# Patient Record
Sex: Female | Born: 1980 | Hispanic: Yes | Marital: Married | Smoking: Never smoker
Health system: Southern US, Community
[De-identification: ages and names within clinical notes are randomized; demographics above are authoritative.]

## PROBLEM LIST (undated history)

## (undated) DIAGNOSIS — F419 Anxiety disorder, unspecified: Secondary | ICD-10-CM

## (undated) DIAGNOSIS — G478 Other sleep disorders: Secondary | ICD-10-CM

## (undated) DIAGNOSIS — Z9889 Other specified postprocedural states: Secondary | ICD-10-CM

## (undated) DIAGNOSIS — F32A Depression, unspecified: Secondary | ICD-10-CM

## (undated) DIAGNOSIS — G709 Myoneural disorder, unspecified: Secondary | ICD-10-CM

## (undated) DIAGNOSIS — T8859XA Other complications of anesthesia, initial encounter: Secondary | ICD-10-CM

## (undated) DIAGNOSIS — F329 Major depressive disorder, single episode, unspecified: Secondary | ICD-10-CM

## (undated) DIAGNOSIS — J40 Bronchitis, not specified as acute or chronic: Secondary | ICD-10-CM

## (undated) DIAGNOSIS — A159 Respiratory tuberculosis unspecified: Secondary | ICD-10-CM

## (undated) DIAGNOSIS — T4145XA Adverse effect of unspecified anesthetic, initial encounter: Secondary | ICD-10-CM

## (undated) HISTORY — PX: WISDOM TOOTH EXTRACTION: SHX21

## (undated) HISTORY — PX: AUGMENTATION MAMMAPLASTY: SUR837

## (undated) HISTORY — PX: COSMETIC SURGERY: SHX468

## (undated) HISTORY — PX: MENISCECTOMY: SHX123

---
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
## 2010-10-10 HISTORY — PX: TURBINATE REDUCTION: SHX6157

## 2013-10-10 DIAGNOSIS — J40 Bronchitis, not specified as acute or chronic: Secondary | ICD-10-CM

## 2013-10-10 HISTORY — DX: Bronchitis, not specified as acute or chronic: J40

## 2015-10-21 ENCOUNTER — Ambulatory Visit: Payer: Managed Care, Other (non HMO) | Attending: Sports Medicine

## 2015-10-21 DIAGNOSIS — R29898 Other symptoms and signs involving the musculoskeletal system: Secondary | ICD-10-CM | POA: Insufficient documentation

## 2015-10-21 DIAGNOSIS — G8929 Other chronic pain: Secondary | ICD-10-CM | POA: Insufficient documentation

## 2015-10-21 DIAGNOSIS — M25561 Pain in right knee: Secondary | ICD-10-CM | POA: Insufficient documentation

## 2015-10-21 NOTE — Therapy (Addendum)
[facility] [address], [place], [place], [address] Phone: [phone]   Fax:  [phone]  Physical Therapy Evaluation  Patient Details  Name: Tracy Hartman MRN: [id number] Date of Birth: [birth year] Referring Provider: Robb Matar, MD  Encounter Date: 10/21/2015      PT End of Session - 10/21/15 1051    Visit Number 1   Date for PT Re-Evaluation 12/16/15   PT Start Time 5277   PT Stop Time 1056   PT Time Calculation (min) 35 min   Activity Tolerance Patient tolerated treatment well   Behavior During Therapy [facility] for tasks assessed/performed      History reviewed. No pertinent past medical history.  History reviewed. No pertinent past surgical history.  There were no vitals filed for this visit.  Visit Diagnosis:  Knee pain, chronic, right - Plan: PT plan of care cert/re-cert  Weakness of right hip - Plan: PT plan of care cert/re-cert      Subjective Assessment - 10/21/15 1027    Subjective Pt presents to PT with complaints of Rt knee pain.  Pt had surgery for menesectomy in 2014.  Recent MRI indicated internal derangement per pt report.  Pt reports that she might need surgery again but is not going to do it now as she just moved from [place].     Pertinent History Rt knee meniscus surgery 2014   Diagnostic tests MRI 04/2015: internal derangement   Patient Stated Goals reduce Rt knee pain, increase strength   Currently in Pain? Yes   Pain Score 3    Pain Location Knee   Pain Orientation Right   Pain Descriptors / Indicators Tightness   Pain Type Acute pain   Pain Onset More than a month ago   Pain Frequency Intermittent   Aggravating Factors  walking    Pain Relieving Factors most of the time, no pain            [facility] PT Assessment - 10/21/15 0001    Assessment   Medical Diagnosis Rt knee medial menisus tear    Referring Provider Robb Matar, MD   Onset Date/Surgical Date 10/20/12   surgery 08/2013   Next MD Visit none   Prior Therapy in 2014 after surgery   Precautions   Precautions None   Restrictions   Weight Bearing Restrictions No   Balance Screen   Has the patient fallen in the past 6 months No   Has the patient had a decrease in activity level because of a fear of falling?  No   Is the patient reluctant to leave their home because of a fear of falling?  No   Home Social worker Private residence   Living Arrangements Spouse/significant other;Children   Type of [place] to enter   Home Layout Two level   Prior Function   Level of Independence Independent   Vocation Unemployed   Vocation Requirements flight attendant- off for 7 months now   [place]   Overall Cognitive Status Within Functional Limits for tasks assessed   Observation/Other Assessments   Focus on Therapeutic Outcomes (FOTO)  37% limitation   Posture/Postural Control   Posture/Postural Control No significant limitations   ROM / Strength   AROM / PROM / Strength AROM;PROM;Strength   AROM   Overall AROM  Within functional limits for tasks performed   Overall AROM Comments full knee and hip AROM bilaterally without  pain   PROM   Overall PROM  Within functional limits for tasks performed   Strength   Overall Strength Deficits   Strength Assessment Site Knee;Hip   Right/Left Hip Right;Left   Right Hip Flexion 4/5   Right Hip Extension 4/5   Right Hip ABduction 4-/5   Left Hip Flexion 4+/5   Left Hip Extension 5/5   Left Hip ABduction 5/5   Right/Left Knee Right;Left   Right Knee Flexion 5/5   Right Knee Extension 4+/5   Left Knee Flexion 5/5   Left Knee Extension 5/5   Palpation   Patella mobility increased patellar mobility on the Rt vs Lt.   Palpation comment no palpable tenderness today   Ambulation/Gait   Ambulation/Gait Yes   Ambulation/Gait Assistance 7: Independent                            PT Education - 10/21/15 1046    Education provided Yes   Education Details HEP: hip flexion, abduction and extension, long arc quads   Person(s) Educated Patient   Methods Explanation;Demonstration;Handout   Comprehension Returned demonstration;Verbalized understanding          PT Short Term Goals - 10/21/15 1100    PT SHORT TERM GOAL #1   Title be independent in initial HEP   Time 4   Period Weeks   Status New           PT Long Term Goals - 10/21/15 1023    PT LONG TERM GOAL #1   Title be independent in advanced HEP   Time 8   Period Weeks   Status New   PT LONG TERM GOAL #2   Title reduce FOTO to < or = to 30% limitaiton   Time 8   Period Weeks   Status New   PT LONG TERM GOAL #3   Title demonstrate 4+/5 Rt hip strength to improve endurance and stability   Time 8   Period Weeks   Status New   PT LONG TERM GOAL #4   Title verbalize and demonstrate advancement of hip and core strength exercises for independence after discharge.                 Plan - 10/21/15 1056    Clinical Impression Statement Pt presents to PT with complaints of Rt knee and hip weakness of a chronic nature. Pt had medial meniscus removal in 2014 and continues to have stiffness, instability and weakness.  Pt demonstrates Rt hip weakness and patellar hypermobility. FOTO score is 37% limitation.  Pt will benefit from skilled PT for hip and core strength, Rt knee stability and prioprioception to improve stability.     Pt will benefit from skilled therapeutic intervention in order to improve on the following deficits Pain;Decreased strength   Rehab Potential Good   PT Frequency 2x / week   PT Duration 6 weeks   PT Treatment/Interventions ADLs/Self Care Home Management;Cryotherapy;Electrical Stimulation;Moist Heat;Therapeutic exercise;Therapeutic activities;Ultrasound;Neuromuscular re-education;Patient/family education;Manual techniques;Passive range of motion   PT Next Visit Plan pilates  based hip strength, Rt knee strength and stabilization   Consulted and Agree with Plan of Care Patient         Problem List There are no active problems to display for this patient.   Trinda Harlacher, PT 10/21/2015, 11:39 AM PHYSICAL THERAPY DISCHARGE SUMMARY  Visits from Start of Care: 1  Current functional level related to goals / functional outcomes: See  above.  Pt didn't return after evaluation.     Remaining deficits: See above for current status.    Education / Equipment: HEP Plan: Patient agrees to discharge.  Patient goals were not met. Patient is being discharged due to not returning since the last visit.  ?????    Sigurd Sos, [place] 12/02/2015 9:48 AM [facility] [address] [place], [place], [address] Phone: [phone]   Fax:  [phone]  Name: Tracy Hartman MRN: [id number] Date of Birth: [birth year]

## 2015-10-21 NOTE — Patient Instructions (Addendum)
2 sets of 10, 2x/day  Hip Flexion / Knee Extension: Straight-Leg Raise (Eccentric)   Lie on back. Lift leg with knee straight. Slowly lower leg for 3-5 seconds. ___ reps per set, ___ sets per day, ___ days per week. Lower like elevator, stopping at each floor. Add ___ lbs when you achieve ___ repetitions. Rest on elbows. Rest on straight arms.  ABDUCTION: Side-Lying (Active)   Lie on left side, top leg straight. Raise top leg as far as possible. Use ___ lbs. Complete ___ sets of ___ repetitions. Perform ___ sessions per day.  http://gtsc.exer.[email or web address]   (Home) Extension: Hip   With support under abdomen, tighten stomach. Lift right leg in line with body. Do not hyperextend. Alternate legs. Repeat ____ times per set. Do ____ sets per session. Do ____ sessions per week.  KNEE: Extension, Long Arc Quads - Sitting    Raise leg until knee is straight.  Hold 5 seconds, do 10-20 on each leg ___ reps per set, _2__ sets per day  Copyright  VHI. All rights reserved.  [facility] [address], [address] [place], [place] [address] Phone # [phone] Fax [phone]

## 2015-11-12 ENCOUNTER — Other Ambulatory Visit: Payer: Self-pay | Admitting: Internal Medicine

## 2015-11-12 DIAGNOSIS — N63 Unspecified lump in unspecified breast: Secondary | ICD-10-CM

## 2015-11-12 DIAGNOSIS — Z9882 Breast implant status: Secondary | ICD-10-CM

## 2015-11-17 ENCOUNTER — Ambulatory Visit
Admission: RE | Admit: 2015-11-17 | Discharge: 2015-11-17 | Disposition: A | Discharge status: Home / Self Care | Payer: Managed Care, Other (non HMO) | Source: Ambulatory Visit | Attending: Internal Medicine | Admitting: Internal Medicine

## 2015-11-17 DIAGNOSIS — N63 Unspecified lump in unspecified breast: Secondary | ICD-10-CM

## 2015-11-17 DIAGNOSIS — Z9882 Breast implant status: Secondary | ICD-10-CM

## 2016-07-15 ENCOUNTER — Other Ambulatory Visit: Payer: Self-pay | Admitting: Internal Medicine

## 2016-07-15 DIAGNOSIS — Z9882 Breast implant status: Secondary | ICD-10-CM

## 2016-07-15 DIAGNOSIS — N6489 Other specified disorders of breast: Secondary | ICD-10-CM

## 2016-07-21 ENCOUNTER — Ambulatory Visit
Admission: RE | Admit: 2016-07-21 | Discharge: 2016-07-21 | Disposition: A | Discharge status: Home / Self Care | Payer: Managed Care, Other (non HMO) | Source: Ambulatory Visit | Attending: Internal Medicine | Admitting: Internal Medicine

## 2016-07-21 DIAGNOSIS — N6489 Other specified disorders of breast: Secondary | ICD-10-CM

## 2016-07-21 DIAGNOSIS — Z9882 Breast implant status: Secondary | ICD-10-CM

## 2016-12-28 ENCOUNTER — Other Ambulatory Visit: Payer: Self-pay | Admitting: Internal Medicine

## 2016-12-28 DIAGNOSIS — N6489 Other specified disorders of breast: Secondary | ICD-10-CM

## 2016-12-30 ENCOUNTER — Ambulatory Visit
Admission: RE | Admit: 2016-12-30 | Discharge: 2016-12-30 | Disposition: A | Discharge status: Home / Self Care | Payer: Managed Care, Other (non HMO) | Source: Ambulatory Visit | Attending: Internal Medicine | Admitting: Internal Medicine

## 2016-12-30 ENCOUNTER — Other Ambulatory Visit: Payer: Self-pay | Admitting: Internal Medicine

## 2016-12-30 DIAGNOSIS — N6489 Other specified disorders of breast: Secondary | ICD-10-CM

## 2017-01-24 ENCOUNTER — Encounter: Payer: Self-pay | Admitting: Physical Therapy

## 2017-01-24 ENCOUNTER — Ambulatory Visit: Payer: Managed Care, Other (non HMO) | Attending: Family Medicine | Admitting: Physical Therapy

## 2017-01-24 DIAGNOSIS — M25661 Stiffness of right knee, not elsewhere classified: Secondary | ICD-10-CM

## 2017-01-24 DIAGNOSIS — R29898 Other symptoms and signs involving the musculoskeletal system: Secondary | ICD-10-CM | POA: Diagnosis present

## 2017-01-24 DIAGNOSIS — M25561 Pain in right knee: Secondary | ICD-10-CM | POA: Diagnosis present

## 2017-01-24 DIAGNOSIS — M6281 Muscle weakness (generalized): Secondary | ICD-10-CM | POA: Insufficient documentation

## 2017-01-24 DIAGNOSIS — G8929 Other chronic pain: Secondary | ICD-10-CM

## 2017-01-24 DIAGNOSIS — M545 Low back pain, unspecified: Secondary | ICD-10-CM

## 2017-01-24 NOTE — Therapy (Signed)
[facility] [address] [place], [place], [address] Phone: [phone]   Fax:  [phone]  Physical Therapy Evaluation  Patient Details  Name: Tracy Hartman MRN: [id number] Date of Birth: 27-Dec-1980 Referring Provider: Otho Darner, MD  Encounter Date: 01/24/2017      PT End of Session - 01/24/17 1904    Visit Number 1   Date for PT Re-Evaluation 03/21/17   PT Start Time 0849   PT Stop Time 0930   PT Time Calculation (min) 41 min   Activity Tolerance Patient tolerated treatment well   Behavior During Therapy [facility] for tasks assessed/performed      History reviewed. No pertinent past medical history.  Past Surgical History:  Procedure Laterality Date  . AUGMENTATION MAMMAPLASTY      There were no vitals filed for this visit.       Subjective Assessment - 01/24/17 0853    Subjective Had a knee injury when 36 y/o and meniscus surgery in 2014.  Had back pain also for years also, but has been getting worse and having problems bending forward.  Did pilates physical therapy in the past which helped.  Knee is tight and feels weak on right LE.     Limitations Walking   How long can you walk comfortably? 30-45 minutes   Patient Stated Goals get rid of the pain, strengthen   Currently in Pain? Yes   Pain Score 3    Pain Location Back   Pain Orientation Mid;Lower   Pain Descriptors / Indicators Dull;Aching   Pain Type Chronic pain   Pain Onset More than a month ago   Pain Frequency Intermittent   Aggravating Factors  bending forward and getting back up from leaning forward   Pain Relieving Factors heat, stretching   Effect of Pain on Daily Activities playing with kids   Multiple Pain Sites Yes   Pain Score 4  when walking a lot   Pain Location Knee   Pain Orientation Right   Pain Descriptors / Indicators Aching;Sharp   Pain Radiating Towards side of the knee or general soreness all over   Pain Onset  More than a month ago   Pain Frequency Intermittent   Aggravating Factors  walking   Pain Relieving Factors ibuprophen, injection, sitting   Effect of Pain on Daily Activities walking            [facility] PT Assessment - 01/24/17 0001      Assessment   Medical Diagnosis thoracolumbar pain; right knee patellofemoral syndrome; s/p menisectomy 2014   Referring Provider Otho Darner, MD   Onset Date/Surgical Date --  36 years old   Prior Therapy yes     Precautions   Precautions None     Restrictions   Weight Bearing Restrictions No     Balance Screen   Has the patient fallen in the past 6 months No   Has the patient had a decrease in activity level because of a fear of falling?  No   Is the patient reluctant to leave their home because of a fear of falling?  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, dispensing and leaning for household activities   Leisure hiking and walking, golfing  Cognition   Overall Cognitive Status Within Functional Limits for tasks assessed     Observation/Other Assessments   Focus on Therapeutic Outcomes (FOTO)  34% limitation  goal 27% limitation     Posture/Postural Control   Posture/Postural Control Postural limitations   Postural Limitations Rounded Shoulders     ROM / Strength   AROM / PROM / Strength Strength;AROM;PROM     AROM   Overall AROM Comments lumbar flexion 25% limited with pain; right knee 10% limited      PROM   Right/Left Knee Right;Left   Right Knee Extension 0   Right Knee Flexion 150   Left Knee Extension 0   Left Knee Flexion 160     Strength   Strength Assessment Site Hip   Right Hip Extension 4-/5   Right Hip ABduction 4-/5   Right Hip ADduction 4-/5   Left Hip ADduction 4-/5     Palpation    Palpation comment right IT band tight     Ambulation/Gait   Gait Pattern Within Functional Limits                           PT Education - 01/24/17 1903    Education provided Yes   Education Details thoracolumbar stretches   Person(s) Educated Patient   Methods Explanation;Verbal cues;Tactile cues;Handout   Comprehension Verbalized understanding;Returned demonstration          PT Short Term Goals - 01/24/17 1906      PT SHORT TERM GOAL #1   Title be independent in initial HEP   Time 4   Period Weeks   Status New     PT SHORT TERM GOAL #2   Title reports 25% reduced back pain during daily activities   Time 4   Period Weeks   Status New           PT Long Term Goals - 01/24/17 1906      PT LONG TERM GOAL #1   Title be independent in advanced HEP   Time 8   Period Weeks   Status New     PT LONG TERM GOAL #2   Title reduce FOTO to < or = to 27% limitaiton   Time 8   Period Weeks   Status New     PT LONG TERM GOAL #3   Title demonstrate 4+/5 Rt hip strength to improve endurance and stability   Time 8   Period Weeks   Status New     PT LONG TERM GOAL #4   Title be able to do activities such as bathe daughter without increased back pain               Plan - 01/24/17 1912    Clinical Impression Statement Pt presents to clinic with knee and back pain.  Patient is active parent and having increased difficulty performing household chores due to increased pain.  Pt has back pain of 3/10, T9-T12 hypomobility and increased muscle spasms of thoracic paraspinals.  Pt also presents with knee pain 4/10 and limited ROM of 150 degrees of flexion compared to 160 degrees on left LE.  Pt has tight IT band Rt LE.  She demonstrates weakness in right hip 4-/5 and core weakness during funcitonal movments.  Patient will benefit from skilled PT to address core and LE weakness and decreased ROM for improved function and pain management.  PT is low  complexity due to no comorbidities.  Rehab Potential Excellent   Clinical Impairments Affecting Rehab Potential n/a   PT Frequency 3x / week   PT Duration 8 weeks   PT Treatment/Interventions ADLs/Self Care Home Management;Biofeedback;Cryotherapy;Iontophoresis /ml Dexamethasone;Moist Heat;Traction;Ultrasound;Gait training;Stair training;Functional mobility training;Therapeutic activities;Therapeutic exercise;Balance training;Neuromuscular re-education;Patient/family education;Manual techniques;Dry needling;Passive range of motion;Taping   PT Next Visit Plan thoracic mobility, manual and modalities as needed, LE and core strengthening   Recommended Other Services n/a   Consulted and Agree with Plan of Care Patient      Patient will benefit from skilled therapeutic intervention in order to improve the following deficits and impairments:  Decreased range of motion, Decreased strength, Hypomobility, Postural dysfunction, Increased muscle spasms, Pain  Visit Diagnosis: Chronic midline low back pain without sciatica  Muscle weakness (generalized)  Chronic pain of right knee  Stiffness of right knee, not elsewhere classified     Problem List There are no active problems to display for this patient.   Vincente Poli, PT 01/24/2017, 7:26 PM  [facility] [address] [place], [place], [address] Phone: [phone]   Fax:  [phone]  Name: Tracy Hartman MRN: [id number] Date of Birth: 08-Apr-1981

## 2017-01-24 NOTE — Patient Instructions (Addendum)
   Supine Knee-to-Chest, Unilateral    Lie on back, hands clasped behind one knee. Pull knee in toward chest until a comfortable stretch is felt in lower back and buttocks. Hold 30___ seconds.  Repeat __2_ times per session. Do _1__ sessions per day.  Copyright  VHI. All rights reserved.  Supine With Rotation    Lie on back with one knee drawn toward chest. Slowly bring bent leg across body until stretch is felt in lower back area. Hold _30__ seconds. Repeat to other side. Repeat _2__ times per session. Do __2_ sessions per day.  Copyright  VHI. All rights reserved.         Book Opening  Setup- -Side lying, Knees bent -Arms out stretched to 90 degrees of shoulder -Head on small cushion or hand under head  Exercise- -Inhale -Exhale, and reach top arm up toward the ceiling, allowing eyes and sternum, ribs and spine to follow(keep legs in starting position) -Continue rotation as far as possible without letting the shoulder press forward(Arm may not touch mat) --Repeat on other side after completing set  Focus on- -not just moving the arm  10x to each side  [facility] [address] [place], [place] [address] Phone # [phone] Fax [phone]

## 2017-01-25 ENCOUNTER — Encounter: Payer: Self-pay | Admitting: Physical Therapy

## 2017-01-25 ENCOUNTER — Ambulatory Visit: Payer: Managed Care, Other (non HMO) | Admitting: Physical Therapy

## 2017-01-25 DIAGNOSIS — M545 Low back pain, unspecified: Secondary | ICD-10-CM

## 2017-01-25 DIAGNOSIS — M6281 Muscle weakness (generalized): Secondary | ICD-10-CM

## 2017-01-25 DIAGNOSIS — G8929 Other chronic pain: Secondary | ICD-10-CM

## 2017-01-25 DIAGNOSIS — M25561 Pain in right knee: Secondary | ICD-10-CM

## 2017-01-25 DIAGNOSIS — M25661 Stiffness of right knee, not elsewhere classified: Secondary | ICD-10-CM

## 2017-01-25 DIAGNOSIS — R29898 Other symptoms and signs involving the musculoskeletal system: Secondary | ICD-10-CM

## 2017-01-25 NOTE — Therapy (Signed)
[facility] [address] [place], [place], [address] Phone: [phone]   Fax:  [phone]  Physical Therapy Treatment  Patient Details  Name: Tracy Hartman MRN: [id number] Date of Birth: December 28, 1980 Referring Provider: Otho Darner, MD  Encounter Date: 01/25/2017      PT End of Session - 01/25/17 0853    Visit Number 2   Date for PT Re-Evaluation 03/21/17   PT Start Time 0845   PT Stop Time 0945   PT Time Calculation (min) 60 min   Activity Tolerance Patient tolerated treatment well   Behavior During Therapy [facility] for tasks assessed/performed      History reviewed. No pertinent past medical history.  Past Surgical History:  Procedure Laterality Date  . AUGMENTATION MAMMAPLASTY      There were no vitals filed for this visit.      Subjective Assessment - 01/25/17 0849    Subjective Knee feels stiff this morning. my back is uncomfortable this morning and I am ovulating which does not help.    Currently in Pain? --  My back is uncomfortable this AM.   Multiple Pain Sites No                         [facility] Adult PT Treatment/Exercise - 01/25/17 0001      Lumbar Exercises: Stretches   Piriformis Stretch Limitations Sidelying thoracic rotation  10x bil      Lumbar Exercises: Supine   Other Supine Lumbar Exercises Foam roll exs: decompression x 1 min, ball squeeze with TA contraction 6x, scissor arms 6x bil, tiny steps alternating 6x, yellow band horizontal abd 10x   Lumbar release series     Knee/Hip Exercises: Aerobic   Nustep L2 x 6 min  Concurrent review of status     Moist Heat Therapy   Number Minutes Moist Heat 15 Minutes   Moist Heat Location --  RT knee and lumbar                PT Education - 01/25/17 0907    Education provided Yes   Education Details Single leg stance RT for HEP   Person(s) Educated Patient   Methods Explanation;Demonstration;Tactile  cues;Verbal cues;Handout   Comprehension Verbalized understanding;Returned demonstration          PT Short Term Goals - 01/24/17 1906      PT SHORT TERM GOAL #1   Title be independent in initial HEP   Time 4   Period Weeks   Status New     PT SHORT TERM GOAL #2   Title reports 25% reduced back pain during daily activities   Time 4   Period Weeks   Status New           PT Long Term Goals - 01/24/17 1906      PT LONG TERM GOAL #1   Title be independent in advanced HEP   Time 8   Period Weeks   Status New     PT LONG TERM GOAL #2   Title reduce FOTO to < or = to 27% limitaiton   Time 8   Period Weeks   Status New     PT LONG TERM GOAL #3   Title demonstrate 4+/5 Rt hip strength to improve endurance and stability   Time 8   Period Weeks   Status New     PT LONG TERM GOAL #4   Title be  able to do activities such as bathe daughter without increased back pain               Plan - 01/25/17 0853    Clinical Impression Statement First session post eval. Pt is compliant in her initial HEP, meeting goal. Pt demonstrated good ability to contract her core.  Added to HEP today to address Rt hip weakness, knee & back pain.    Rehab Potential Excellent   Clinical Impairments Affecting Rehab Potential n/a   PT Frequency 3x / week   PT Duration 8 weeks   PT Treatment/Interventions ADLs/Self Care Home Management;Biofeedback;Cryotherapy;Iontophoresis /ml Dexamethasone;Moist Heat;Traction;Ultrasound;Gait training;Stair training;Functional mobility training;Therapeutic activities;Therapeutic exercise;Balance training;Neuromuscular re-education;Patient/family education;Manual techniques;Dry needling;Passive range of motion;Taping   PT Next Visit Plan review new HEp given today, progress core strength, continue to work with foam roll   Consulted and Agree with Plan of Care Patient      Patient will benefit from skilled therapeutic intervention in order to improve the  following deficits and impairments:  Decreased range of motion, Decreased strength, Hypomobility, Postural dysfunction, Increased muscle spasms, Pain  Visit Diagnosis: Chronic midline low back pain without sciatica  Muscle weakness (generalized)  Chronic pain of right knee  Stiffness of right knee, not elsewhere classified  Weakness of right hip     Problem List There are no active problems to display for this patient.   Teghan Philbin, PTA 01/25/2017, 9:34 AM  [facility] [address], [address] [place], [place], [address] Phone: [phone]   Fax:  [phone]  Name: Jaquanna Ballentine MRN: [id number] Date of Birth: [birth year]

## 2017-01-25 NOTE — Patient Instructions (Signed)
Foam roll on [employer]: OPTP Pro Soft Roller. Comes in blue or pink   #1 Single leg stance exercise for the RIGHT leg: Shift your weight into the right leg, contract the quad and glutes gently. Then balance on the right leg 10sec up to 30 sec. Make sure to lift from the lower belly and relax the shoulders and ribs. Do this 1-3 reps but sprinkle the exercise throughout the day.    Supine: Leg Stretch With Strap (Basic)    Lie on back with one knee bent, foot flat on floor. Hook strap around other foot. Straighten knee. Keep knee level with other knee. Hold __30_ seconds. Relax leg completely down to floor.  Repeat 3___ times per session. Do __1-3_ sessions per day.  Copyright  VHI. All rights reserved.

## 2017-01-27 ENCOUNTER — Encounter: Payer: Self-pay | Admitting: Physical Therapy

## 2017-01-27 ENCOUNTER — Ambulatory Visit: Payer: Managed Care, Other (non HMO) | Admitting: Physical Therapy

## 2017-01-27 DIAGNOSIS — M545 Low back pain, unspecified: Secondary | ICD-10-CM

## 2017-01-27 DIAGNOSIS — M6281 Muscle weakness (generalized): Secondary | ICD-10-CM

## 2017-01-27 DIAGNOSIS — G8929 Other chronic pain: Secondary | ICD-10-CM

## 2017-01-27 DIAGNOSIS — M25561 Pain in right knee: Secondary | ICD-10-CM

## 2017-01-27 DIAGNOSIS — M25661 Stiffness of right knee, not elsewhere classified: Secondary | ICD-10-CM

## 2017-01-27 NOTE — Therapy (Signed)
[facility] [address], [address] [place], [place], [address] Phone: [phone]   Fax:  [phone]  Physical Therapy Treatment  Patient Details  Name: Tracy Hartman MRN: [id number] Date of Birth: 09-01-1981 Referring Provider: Otho Darner, MD  Encounter Date: 01/27/2017      PT End of Session - 01/27/17 0849    Visit Number 3   Date for PT Re-Evaluation 03/21/17   PT Start Time 0849   PT Stop Time 0945   PT Time Calculation (min) 56 min   Activity Tolerance Patient tolerated treatment well   Behavior During Therapy [facility] for tasks assessed/performed      History reviewed. No pertinent past medical history.  Past Surgical History:  Procedure Laterality Date  . AUGMENTATION MAMMAPLASTY      There were no vitals filed for this visit.      Subjective Assessment - 01/27/17 0851    Subjective Felt good after session. Pain seems to becoming more local just below the bra strap area. Denies pain this AM.    Currently in Pain? No/denies   Multiple Pain Sites No                         [facility] Adult PT Treatment/Exercise - 01/27/17 0001      Therapeutic Activites    Therapeutic Activities --  Ways to bath daughter while supporting her back     Lumbar Exercises: Supine   Bridge 10 reps;3 seconds   Other Supine Lumbar Exercises Foam roll exs: decompression x 2 min, ball squeeze with TA contraction 8x, scissor arms 8x bil, tiny steps alternating 8x, yellow band horizontal abd 10x   Lumbar release series   Other Supine Lumbar Exercises Thoracic extension on foam roll     Knee/Hip Exercises: Stretches   Hip Flexor Stretch Both;3 reps;30 seconds     Knee/Hip Exercises: Aerobic   Nustep L3 x 7 min  Concurrent review of status     Knee/Hip Exercises: Supine   Straight Leg Raises --  Pilates style SLR 10x     Knee/Hip Exercises: Sidelying   Hip ABduction --  10x PIlates bias     Knee/Hip  Exercises: Prone   Hip Extension --  10x Pilates bias     Moist Heat Therapy   Number Minutes Moist Heat 15 Minutes   Moist Heat Location --  RT knee and lumbar                  PT Short Term Goals - 01/24/17 1906      PT SHORT TERM GOAL #1   Title be independent in initial HEP   Time 4   Period Weeks   Status New     PT SHORT TERM GOAL #2   Title reports 25% reduced back pain during daily activities   Time 4   Period Weeks   Status New           PT Long Term Goals - 01/24/17 1906      PT LONG TERM GOAL #1   Title be independent in advanced HEP   Time 8   Period Weeks   Status New     PT LONG TERM GOAL #2   Title reduce FOTO to < or = to 27% limitaiton   Time 8   Period Weeks   Status New     PT LONG TERM GOAL #3   Title demonstrate 4+/5  Rt hip strength to improve endurance and stability   Time 8   Period Weeks   Status New     PT LONG TERM GOAL #4   Title be able to do activities such as bathe daughter without increased back pain               Plan - 01/27/17 0856    Clinical Impression Statement Pt reports feeling slightly better first week of PT and doing her HEP. Pt was very challenged by the SLR x 3 directions. No pain during the session.  Continues to be compliant with her HEP. Regarding her pain levels, pt reports that pain in her back is still present at times BUT it seems to be not as intense.     Rehab Potential Excellent   Clinical Impairments Affecting Rehab Potential n/a   PT Frequency 3x / week   PT Duration 8 weeks   PT Treatment/Interventions ADLs/Self Care Home Management;Biofeedback;Cryotherapy;Iontophoresis /ml Dexamethasone;Moist Heat;Traction;Ultrasound;Gait training;Stair training;Functional mobility training;Therapeutic activities;Therapeutic exercise;Balance training;Neuromuscular re-education;Patient/family education;Manual techniques;Dry needling;Passive range of motion;Taping   PT Next Visit Plan RT hip  strength, cont with core strength and work towards Rt quads.   Consulted and Agree with Plan of Care Patient      Patient will benefit from skilled therapeutic intervention in order to improve the following deficits and impairments:  Decreased range of motion, Decreased strength, Hypomobility, Postural dysfunction, Increased muscle spasms, Pain  Visit Diagnosis: Chronic midline low back pain without sciatica  Muscle weakness (generalized)  Chronic pain of right knee  Stiffness of right knee, not elsewhere classified     Problem List There are no active problems to display for this patient.   Koty Anctil, PTA 01/27/2017, 9:27 AM   [facility] [address], [address] [place], [place], [address] Phone: [phone]   Fax:  [phone]  Name: Tracy Hartman MRN: [id number] Date of Birth: 03-31-1981

## 2017-01-30 ENCOUNTER — Encounter: Payer: Self-pay | Admitting: Physical Therapy

## 2017-01-30 ENCOUNTER — Ambulatory Visit: Payer: Managed Care, Other (non HMO) | Admitting: Physical Therapy

## 2017-01-30 DIAGNOSIS — M545 Low back pain, unspecified: Secondary | ICD-10-CM

## 2017-01-30 DIAGNOSIS — M25661 Stiffness of right knee, not elsewhere classified: Secondary | ICD-10-CM

## 2017-01-30 DIAGNOSIS — M6281 Muscle weakness (generalized): Secondary | ICD-10-CM

## 2017-01-30 DIAGNOSIS — G8929 Other chronic pain: Secondary | ICD-10-CM

## 2017-01-30 DIAGNOSIS — M25561 Pain in right knee: Secondary | ICD-10-CM

## 2017-01-30 DIAGNOSIS — R29898 Other symptoms and signs involving the musculoskeletal system: Secondary | ICD-10-CM

## 2017-01-30 NOTE — Therapy (Signed)
[facility] [address], [place], [place], [address] Phone: [phone]   Fax:  [phone]  Physical Therapy Treatment  Patient Details  Name: Tracy Hartman MRN: [id number] Date of Birth: Oct 21, 1980 Referring Provider: Gentry Fitz, MD  Encounter Date: 01/30/2017      PT End of Session - 01/30/17 0853    Visit Number 4   Date for PT Re-Evaluation 03/21/17   PT Start Time 0853   PT Stop Time 1010   PT Time Calculation (min) 77 min   Activity Tolerance Patient tolerated treatment well   Behavior During Therapy [facility] for tasks assessed/performed      History reviewed. No pertinent past medical history.  Past Surgical History:  Procedure Laterality Date  . AUGMENTATION MAMMAPLASTY      There were no vitals filed for this visit.      Subjective Assessment - 01/30/17 0855    Subjective I am feeling better, mack remains with some pain intermittently   Currently in Pain? No/denies   Multiple Pain Sites No                         [facility] Adult PT Treatment/Exercise - 01/30/17 0001      Lumbar Exercises: Supine   Bridge 10 reps;3 seconds  VC for segmental articulation   Other Supine Lumbar Exercises Foam roll exs: decompression x 1 min, ball squeeze with TA contraction 10x, scissor arms 8x bil, tiny steps with LE in table top alternating 10x, red band horizontal abd 10x 2  Lumbar release series   Other Supine Lumbar Exercises Thoracic extension on foam roll     Lumbar Exercises: Quadruped   Madcat/Old Horse 10 reps     Knee/Hip Exercises: Aerobic   Nustep L3 x 10 min     Moist Heat Therapy   Number Minutes Moist Heat 15 Minutes   Moist Heat Location --  RT knee and lumbar & thoracic     Manual Therapy   Manual Therapy Soft tissue mobilization   Soft tissue mobilization RT thoracic to lumbar, stretching to QL in sidelying                PT Education - 01/30/17 0920    Education provided Yes   Education Details Cat Camel   Person(s) Educated Patient   Methods Explanation;Demonstration;Tactile cues;Verbal cues;Handout   Comprehension Verbalized understanding;Returned demonstration          PT Short Term Goals - 01/30/17 0859      PT SHORT TERM GOAL #1   Title be independent in initial HEP   Time 4   Period Weeks   Status Achieved     PT SHORT TERM GOAL #2   Title reports 25% reduced back pain during daily activities   Time 4   Period Weeks   Status Achieved           PT Long Term Goals - 01/24/17 1906      PT LONG TERM GOAL #1   Title be independent in advanced HEP   Time 8   Period Weeks   Status New     PT LONG TERM GOAL #2   Title reduce FOTO to < or = to 27% limitaiton   Time 8   Period Weeks   Status New     PT LONG TERM GOAL #3   Title demonstrate 4+/5 Rt hip strength to improve endurance and stability  Time 8   Period Weeks   Status New     PT LONG TERM GOAL #4   Title be able to do activities such as bathe daughter without increased back pain               Plan - 01/30/17 0949    Clinical Impression Statement Pt has met all STG as of today. She has no pain washing dishes in the last 2 days although she stills feels a lot of stiffness with her stretching in the morning. Pt tends to shorten her QL during exercises which wew stretched out today and it felt better. Pt struggles with body awareness especially thoracic extension. Gave cat cow for HEP to work on this at home.     Rehab Potential Excellent   Clinical Impairments Affecting Rehab Potential n/a   PT Frequency 3x / week   PT Duration 8 weeks   PT Treatment/Interventions ADLs/Self Care Home Management;Biofeedback;Cryotherapy;Iontophoresis 50m/ml Dexamethasone;Moist Heat;Traction;Ultrasound;Gait training;Stair training;Functional mobility training;Therapeutic activities;Therapeutic exercise;Balance training;Neuromuscular re-education;Patient/family  education;Manual techniques;Dry needling;Passive range of motion;Taping   PT Next Visit Plan RT hip strength, cont with core strength and thoracic extension ROM. Check the cat cow HEp given today to see if she has improved with thoracic extension.    Consulted and Agree with Plan of Care Patient      Patient will benefit from skilled therapeutic intervention in order to improve the following deficits and impairments:  Decreased range of motion, Decreased strength, Hypomobility, Postural dysfunction, Increased muscle spasms, Pain  Visit Diagnosis: Chronic midline low back pain without sciatica  Muscle weakness (generalized)  Chronic pain of right knee  Stiffness of right knee, not elsewhere classified  Weakness of right hip     Problem List There are no active problems to display for this patient.   Evely Gainey, PTA 01/30/2017, 9:53 AM  [facility] [address] [place] [address]: [phone]  Fax:  [phone] Name: Tracy MuraskiMRN: [id number] of Birth: 9August 08, 1982

## 2017-01-30 NOTE — Patient Instructions (Addendum)
Cat / Cow Flow    Inhale, press spine toward ceiling like a Halloween cat. Keeping strength in arms and abdominals, exhale to soften spine through neutral and into cow pose. Open chest and arch back. Initiate movement between cat and cow at tailbone, one vertebrae at a time. Repeat _5-10___ times.

## 2017-02-01 ENCOUNTER — Encounter: Payer: Self-pay | Admitting: Physical Therapy

## 2017-02-01 ENCOUNTER — Ambulatory Visit: Payer: Managed Care, Other (non HMO) | Admitting: Physical Therapy

## 2017-02-01 DIAGNOSIS — G8929 Other chronic pain: Secondary | ICD-10-CM

## 2017-02-01 DIAGNOSIS — M545 Low back pain, unspecified: Secondary | ICD-10-CM

## 2017-02-01 DIAGNOSIS — R29898 Other symptoms and signs involving the musculoskeletal system: Secondary | ICD-10-CM

## 2017-02-01 DIAGNOSIS — M6281 Muscle weakness (generalized): Secondary | ICD-10-CM

## 2017-02-01 DIAGNOSIS — M25561 Pain in right knee: Secondary | ICD-10-CM

## 2017-02-01 DIAGNOSIS — M25661 Stiffness of right knee, not elsewhere classified: Secondary | ICD-10-CM

## 2017-02-01 NOTE — Therapy (Signed)
[facility] [address] [place], [place], [address] Phone: [phone]   Fax:  [phone]  Physical Therapy Treatment  Patient Details  Name: Tracy Hartman MRN: [id number] Date of Birth: May 23, 1981 Referring Provider: Otho Darner, MD  Encounter Date: 02/01/2017      PT End of Session - 02/01/17 1448    Visit Number 5   Date for PT Re-Evaluation 03/21/17   PT Start Time 1448   PT Stop Time 1545   PT Time Calculation (min) 57 min   Activity Tolerance Patient tolerated treatment well   Behavior During Therapy [facility] for tasks assessed/performed      History reviewed. No pertinent past medical history.  Past Surgical History:  Procedure Laterality Date  . AUGMENTATION MAMMAPLASTY      There were no vitals filed for this visit.      Subjective Assessment - 02/01/17 1449    Subjective I tried squatting to rinse the tub yesterday and my back hurt. I forgot to try to kneeling and using the tub for support. I have no pain right now . Getting knee injection of Synvisc tomorrow.    Currently in Pain? No/denies   Multiple Pain Sites No                         [facility] Adult PT Treatment/Exercise - 02/01/17 0001      Lumbar Exercises: Stretches   Lower Trunk Rotation Limitations 3x bil      Lumbar Exercises: Supine   Bridge 20 reps;3 seconds  VC for segmental articulation   Other Supine Lumbar Exercises Foam roll exs: decompression x 1 min, ball squeeze with TA contraction 10x, scissor arms 10x bil, tiny steps with LE in table top alternating 10x, red band horizontal abd 15x 2  Lumbar release series   Other Supine Lumbar Exercises Thoracic extension on foam roll     Lumbar Exercises: Quadruped   Madcat/Old Horse 10 reps     Knee/Hip Exercises: Aerobic   Nustep L4 x 10  min  PTA present     Knee/Hip Exercises: Sidelying   Hip ABduction --  10x2  PIlates bias 1# added     Knee/Hip  Exercises: Prone   Hamstring Curl 1 set;10 reps   Hamstring Curl Limitations 1#   Hip Extension --  10x2  Pilates bias 1# added     Moist Heat Therapy   Number Minutes Moist Heat 15 Minutes   Moist Heat Location --  RT knee and lumbar & thoracic                  PT Short Term Goals - 01/30/17 0859      PT SHORT TERM GOAL #1   Title be independent in initial HEP   Time 4   Period Weeks   Status Achieved     PT SHORT TERM GOAL #2   Title reports 25% reduced back pain during daily activities   Time 4   Period Weeks   Status Achieved           PT Long Term Goals - 01/24/17 1906      PT LONG TERM GOAL #1   Title be independent in advanced HEP   Time 8   Period Weeks   Status New     PT LONG TERM GOAL #2   Title reduce FOTO to < or = to 27% limitaiton   Time 8  Period Weeks   Status New     PT LONG TERM GOAL #3   Title demonstrate 4+/5 Rt hip strength to improve endurance and stability   Time 8   Period Weeks   Status New     PT LONG TERM GOAL #4   Title be able to do activities such as bathe daughter without increased back pain               Plan - 02/01/17 1448    Clinical Impression Statement Pt is compliant with her HEP, reporting "I feel it when I stretch." She reports she cannot feel if she moves her spine where we want her to move.  The CAt/COW exercise is improving as pt could get some mid back extension today.    Rehab Potential Excellent   Clinical Impairments Affecting Rehab Potential n/a   PT Frequency 3x / week   PT Duration 8 weeks   PT Treatment/Interventions ADLs/Self Care Home Management;Biofeedback;Cryotherapy;Iontophoresis /ml Dexamethasone;Moist Heat;Traction;Ultrasound;Gait training;Stair training;Functional mobility training;Therapeutic activities;Therapeutic exercise;Balance training;Neuromuscular re-education;Patient/family education;Manual techniques;Dry needling;Passive range of motion;Taping   PT Next Visit Plan  RT hip strength, cont with core strength and thoracic extension ROM.     Consulted and Agree with Plan of Care Patient      Patient will benefit from skilled therapeutic intervention in order to improve the following deficits and impairments:  Decreased range of motion, Decreased strength, Hypomobility, Postural dysfunction, Increased muscle spasms, Pain  Visit Diagnosis: Chronic midline low back pain without sciatica  Muscle weakness (generalized)  Chronic pain of right knee  Stiffness of right knee, not elsewhere classified  Weakness of right hip     Problem List There are no active problems to display for this patient.   Tracy Hartman, PTA 02/01/2017, 3:24 PM  [facility] [address], [address] [place], [place], [address] Phone: [phone]   Fax:  [phone]  Name: Tracy Hartman MRN: [id number] Date of Birth: 27-Jan-1981

## 2017-02-03 ENCOUNTER — Encounter: Payer: Self-pay | Admitting: Physical Therapy

## 2017-02-03 ENCOUNTER — Ambulatory Visit: Payer: Managed Care, Other (non HMO) | Admitting: Physical Therapy

## 2017-02-03 DIAGNOSIS — G8929 Other chronic pain: Secondary | ICD-10-CM

## 2017-02-03 DIAGNOSIS — M545 Low back pain, unspecified: Secondary | ICD-10-CM

## 2017-02-03 DIAGNOSIS — M6281 Muscle weakness (generalized): Secondary | ICD-10-CM

## 2017-02-03 DIAGNOSIS — M25661 Stiffness of right knee, not elsewhere classified: Secondary | ICD-10-CM

## 2017-02-03 DIAGNOSIS — R29898 Other symptoms and signs involving the musculoskeletal system: Secondary | ICD-10-CM

## 2017-02-03 DIAGNOSIS — M25561 Pain in right knee: Secondary | ICD-10-CM

## 2017-02-03 NOTE — Therapy (Signed)
[facility] [address], [address] [place], [place], [address] Phone: [phone]   Fax:  [phone]  Physical Therapy Treatment  Patient Details  Name: Tracy Hartman MRN: [id number] Date of Birth: 05-Aug-1981 Referring Provider: Otho Darner, MD  Encounter Date: 02/03/2017      PT End of Session - 02/03/17 0851    Visit Number 6   Date for PT Re-Evaluation 03/21/17   PT Start Time 0850   PT Stop Time 0940   PT Time Calculation (min) 50 min   Activity Tolerance Patient tolerated treatment well   Behavior During Therapy [facility] for tasks assessed/performed      History reviewed. No pertinent past medical history.  Past Surgical History:  Procedure Laterality Date  . AUGMENTATION MAMMAPLASTY      There were no vitals filed for this visit.      Subjective Assessment - 02/03/17 0853    Subjective Received Synvesc injection in her knee yesterday. She was really sore after and it throbbed all night. She requests to take it easy on the knee today.    Currently in Pain? No/denies   Multiple Pain Sites No                         [facility] Adult PT Treatment/Exercise - 02/03/17 0001      Lumbar Exercises: Aerobic   UBE (Upper Arm Bike) Sitting on green ball L1 3x3 with VC for core & posture     Lumbar Exercises: Supine   Other Supine Lumbar Exercises Foam roll exs: decompression x 1 min, ball squeeze with TA contraction 10x, scissor arms 10x bil, tiny steps with LE in table top alternating 10x, red band horizontal abd 15x 2  Lumbar release series   Other Supine Lumbar Exercises Thoracic extension on foam roll     Knee/Hip Exercises: Standing   Other Standing Knee Exercises Single leg stance RT: LT thigh isometric press into wall: 2 x10 sec  Some knee soreness     Knee/Hip Exercises: Sidelying   Hip ABduction --  10x3  PIlates bias 1# added     Knee/Hip Exercises: Prone   Hip Extension --  10x3   Pilates bias 1# added     Moist Heat Therapy   Number Minutes Moist Heat 15 Minutes   Moist Heat Location --  Thoracic in hooklying     Cryotherapy   Number Minutes Cryotherapy 15 Minutes   Cryotherapy Location Knee   Type of Cryotherapy Ice pack                  PT Short Term Goals - 01/30/17 0859      PT SHORT TERM GOAL #1   Title be independent in initial HEP   Time 4   Period Weeks   Status Achieved     PT SHORT TERM GOAL #2   Title reports 25% reduced back pain during daily activities   Time 4   Period Weeks   Status Achieved           PT Long Term Goals - 01/24/17 1906      PT LONG TERM GOAL #1   Title be independent in advanced HEP   Time 8   Period Weeks   Status New     PT LONG TERM GOAL #2   Title reduce FOTO to < or = to 27% limitaiton   Time 8   Period Weeks  Status New     PT LONG TERM GOAL #3   Title demonstrate 4+/5 Rt hip strength to improve endurance and stability   Time 8   Period Weeks   Status New     PT LONG TERM GOAL #4   Title be able to do activities such as bathe daughter without increased back pain               Plan - 02/03/17 0900    Clinical Impression Statement Pt had knee injection ( Synvesc) yesterday. Pt had no pain or soreness during her seesion, but was apprehensive to do much with her knee. The knee was slightly swollen as compared to the left.  We left out certain exercises like Cat/Cow that would put pressure on her knee but attempted to focus on hip strength with the knee straight.     Rehab Potential Excellent   Clinical Impairments Affecting Rehab Potential n/a   PT Frequency 3x / week   PT Duration 8 weeks   PT Treatment/Interventions ADLs/Self Care Home Management;Biofeedback;Cryotherapy;Iontophoresis /ml Dexamethasone;Moist Heat;Traction;Ultrasound;Gait training;Stair training;Functional mobility training;Therapeutic activities;Therapeutic exercise;Balance training;Neuromuscular  re-education;Patient/family education;Manual techniques;Dry needling;Passive range of motion;Taping   Consulted and Agree with Plan of Care Patient      Patient will benefit from skilled therapeutic intervention in order to improve the following deficits and impairments:  Decreased range of motion, Decreased strength, Hypomobility, Postural dysfunction, Increased muscle spasms, Pain  Visit Diagnosis: Chronic midline low back pain without sciatica  Muscle weakness (generalized)  Chronic pain of right knee  Stiffness of right knee, not elsewhere classified  Weakness of right hip     Problem List There are no active problems to display for this patient.   Britaney Espaillat, PTA 02/03/2017, 9:26 AM  [place] [address] [place], [place], [address] Phone: [phone]   Fax:  [phone]  Name: Fotini Lemus MRN: [id number] Date of Birth: [birth year]

## 2017-02-06 ENCOUNTER — Ambulatory Visit: Payer: Managed Care, Other (non HMO) | Admitting: Physical Therapy

## 2017-02-06 ENCOUNTER — Encounter: Payer: Self-pay | Admitting: Physical Therapy

## 2017-02-06 DIAGNOSIS — G8929 Other chronic pain: Secondary | ICD-10-CM

## 2017-02-06 DIAGNOSIS — R29898 Other symptoms and signs involving the musculoskeletal system: Secondary | ICD-10-CM

## 2017-02-06 DIAGNOSIS — M545 Low back pain, unspecified: Secondary | ICD-10-CM

## 2017-02-06 DIAGNOSIS — M25561 Pain in right knee: Secondary | ICD-10-CM

## 2017-02-06 DIAGNOSIS — M6281 Muscle weakness (generalized): Secondary | ICD-10-CM

## 2017-02-06 DIAGNOSIS — M25661 Stiffness of right knee, not elsewhere classified: Secondary | ICD-10-CM

## 2017-02-06 NOTE — Therapy (Addendum)
[facility] [address], [place], [place], [address] Phone: [phone]   Fax:  [phone]  Physical Therapy Treatment  Patient Details  Name: Tracy Hartman MRN: [id number] Date of Birth: 01/03/1981 Referring Provider: Gentry Fitz, MD  Encounter Date: 02/06/2017      PT End of Session - 02/06/17 0858    Visit Number 7   Date for PT Re-Evaluation 03/21/17   PT Start Time 0856  Pt late   PT Stop Time 0955   PT Time Calculation (min) 59 min   Activity Tolerance Patient tolerated treatment well   Behavior During Therapy [facility] for tasks assessed/performed      History reviewed. No pertinent past medical history.  Past Surgical History:  Procedure Laterality Date  . AUGMENTATION MAMMAPLASTY      There were no vitals filed for this visit.      Subjective Assessment - 02/06/17 0859    Subjective I iced my knee over the weekend. That helped but it hurt Sunday when we were walking. Felt like it wanted to pop. I just woke up so my back is stiff and sore.    Currently in Pain? Yes   Pain Location Back   Pain Orientation Right;Mid;Lower;Left   Pain Descriptors / Indicators Tightness;Sore   Aggravating Factors  Overdoing it   Pain Relieving Factors heat, rest   Multiple Pain Sites No                         [facility] Adult PT Treatment/Exercise - 02/06/17 0001      Lumbar Exercises: Stretches   Lower Trunk Rotation Limitations Sidelying thoracic rotation stretches bil 10x. Ball bt knees to stabilize lower qtr     Lumbar Exercises: Supine   Other Supine Lumbar Exercises Foam roll exs: decompression x 1 min, ball squeeze with TA contraction 10x, red band lat stretch overhead 10x bil, tiny steps with LE in table top alternating 10x, red band horizontal abd 15x 2  Lumbar release series   Other Supine Lumbar Exercises Thoracic extension on foam roll  Lumbar release sequence additionally     Knee/Hip Exercises: Stretches   Hip Flexor Stretch Left;1 rep;30 seconds     Knee/Hip Exercises: Aerobic   Nustep L4 x 10  min  PTA present     Knee/Hip Exercises: Sidelying   Hip ABduction Strengthening;Right;2 sets;10 reps  2# Pilates bias     Knee/Hip Exercises: Prone   Hip Extension --  10x3  Pilates bias 2# added     Moist Heat Therapy   Number Minutes Moist Heat 15 Minutes   Moist Heat Location --  Lumbar & Thoracic     Cryotherapy   Number Minutes Cryotherapy 15 Minutes   Cryotherapy Location Knee   Type of Cryotherapy Ice pack                  PT Short Term Goals - 01/30/17 0859      PT SHORT TERM GOAL #1   Title be independent in initial HEP   Time 4   Period Weeks   Status Achieved     PT SHORT TERM GOAL #2   Title reports 25% reduced back pain during daily activities   Time 4   Period Weeks   Status Achieved           PT Long Term Goals - 02/06/17 6237      PT LONG TERM GOAL #  4   Title be able to do activities such as bathe daughter without increased back pain   Status On-going               Plan - 02/06/17 2951    Clinical Impression Statement Knee has been touchy since her injection last week. Pt reported that she didn't even think about her back hurting over the weekend. Rt hip progressing in strength as we were able to add wt to her exercises maintaining proper form.    Rehab Potential Excellent   Clinical Impairments Affecting Rehab Potential n/a   PT Frequency 3x / week   PT Duration 8 weeks   PT Treatment/Interventions ADLs/Self Care Home Management;Biofeedback;Cryotherapy;Iontophoresis 63m/ml Dexamethasone;Moist Heat;Traction;Ultrasound;Gait training;Stair training;Functional mobility training;Therapeutic activities;Therapeutic exercise;Balance training;Neuromuscular re-education;Patient/family education;Manual techniques;Dry needling;Passive range of motion;Taping   PT Next Visit Plan MMT Rt hip, make sure pt engages  her core! Thoracic stretches, RT hip & knee strength, cont with core stretngth exs   Consulted and Agree with Plan of Care Patient      Patient will benefit from skilled therapeutic intervention in order to improve the following deficits and impairments:  Decreased range of motion, Decreased strength, Hypomobility, Postural dysfunction, Increased muscle spasms, Pain  Visit Diagnosis: Chronic midline low back pain without sciatica  Muscle weakness (generalized)  Chronic pain of right knee  Stiffness of right knee, not elsewhere classified  Weakness of right hip     Problem List There are no active problems to display for this patient.   Lenni Reckner, PTA 02/06/2017, 9:34 AM  [facility] [address] [place] [address]Phone: [phone]  Fax:  [phone] Name: Tracy AlperMRN: [id number] of Birth: 9Feb 09, 1982 PHYSICAL THERAPY DISCHARGE SUMMARY  Visits from Start of Care: 7  Current functional level related to goals / functional outcomes: See above details   Remaining deficits: See above details   Education / Equipment: HEP  Plan: Patient agrees to discharge.  Patient goals were partially met. Patient is being discharged due to not returning since the last visit.  ?????   JZannie Cove PT 03/20/17 4:41 PM

## 2017-04-19 ENCOUNTER — Encounter (HOSPITAL_COMMUNITY): Payer: Self-pay | Admitting: Emergency Medicine

## 2017-04-19 ENCOUNTER — Emergency Department (HOSPITAL_COMMUNITY)
Admission: EM | Admit: 2017-04-19 | Discharge: 2017-04-20 | Disposition: A | Discharge status: Home / Self Care | Payer: Managed Care, Other (non HMO) | Attending: Emergency Medicine | Admitting: Emergency Medicine

## 2017-04-19 ENCOUNTER — Emergency Department (HOSPITAL_COMMUNITY): Payer: Managed Care, Other (non HMO)

## 2017-04-19 DIAGNOSIS — R05 Cough: Secondary | ICD-10-CM | POA: Insufficient documentation

## 2017-04-19 DIAGNOSIS — R5383 Other fatigue: Secondary | ICD-10-CM | POA: Diagnosis not present

## 2017-04-19 DIAGNOSIS — R110 Nausea: Secondary | ICD-10-CM | POA: Diagnosis not present

## 2017-04-19 DIAGNOSIS — R0981 Nasal congestion: Secondary | ICD-10-CM | POA: Diagnosis not present

## 2017-04-19 DIAGNOSIS — R590 Localized enlarged lymph nodes: Secondary | ICD-10-CM | POA: Diagnosis not present

## 2017-04-19 DIAGNOSIS — H9209 Otalgia, unspecified ear: Secondary | ICD-10-CM | POA: Insufficient documentation

## 2017-04-19 DIAGNOSIS — J028 Acute pharyngitis due to other specified organisms: Secondary | ICD-10-CM

## 2017-04-19 DIAGNOSIS — J029 Acute pharyngitis, unspecified: Secondary | ICD-10-CM | POA: Insufficient documentation

## 2017-04-19 DIAGNOSIS — B9789 Other viral agents as the cause of diseases classified elsewhere: Secondary | ICD-10-CM | POA: Diagnosis not present

## 2017-04-19 DIAGNOSIS — R059 Cough, unspecified: Secondary | ICD-10-CM

## 2017-04-19 LAB — RAPID STREP SCREEN (MED CTR MEBANE ONLY): STREPTOCOCCUS, GROUP A SCREEN (DIRECT): NEGATIVE

## 2017-04-19 MED ORDER — IBUPROFEN 800 MG PO TABS
800.0000 mg | ORAL_TABLET | Freq: Once | ORAL | Status: AC
  Administered 2017-04-19: 800 mg via ORAL
  Filled 2017-04-19: qty 1

## 2017-04-19 MED ORDER — ACETAMINOPHEN 325 MG PO TABS
650.0000 mg | ORAL_TABLET | Freq: Once | ORAL | Status: DC | PRN
Start: 2017-04-19 — End: 2017-04-19

## 2017-04-19 MED ORDER — GI COCKTAIL ~~LOC~~
30.0000 mL | Freq: Once | ORAL | Status: DC
  Filled 2017-04-19: qty 30

## 2017-04-19 NOTE — ED Notes (Signed)
Pt very frantic, consumed with the question at triage of "would you receive blood transfusion if needed?" Pt calmed down with positive reinforcement. Pt now calm HR decreased from 155.

## 2017-04-19 NOTE — ED Notes (Signed)
Patient transported to X-ray 

## 2017-04-19 NOTE — ED Triage Notes (Signed)
Cough and sore throat x3 weeks. Has taken abx and pain meds. Mono and Strep were collected.

## 2017-04-19 NOTE — Discharge Instructions (Addendum)
Please see your primary doctor in 1 week if not better. We suspect viral infection right now.   Please take Ibuprofen (Advil, motrin) and Tylenol (acetaminophen) to relieve your pain.  In between doses of ibuprofen you make take tylenol, up to 1,000 mg (two extra strength pills).  Do not take more than 3,000 mg tylenol in a 24 hour period.  Please check all medication labels as many medications such as pain and cold medications may contain tylenol.  Do not drink alcohol while taking these medications.  Do not take other NSAID'S while taking ibuprofen (such as aleve or naproxen).  Please take ibuprofen with food to decrease stomach upset.

## 2017-04-20 MED ORDER — KETOROLAC TROMETHAMINE 30 MG/ML IJ SOLN
INTRAMUSCULAR | Status: AC
  Administered 2017-04-20: 30 mg via INTRAVENOUS
  Filled 2017-04-20: qty 1

## 2017-04-20 MED ORDER — IBUPROFEN 600 MG PO TABS: 600.0000 mg | ORAL_TABLET | Freq: Four times a day (QID) | ORAL | 0 refills | Status: DC | PRN

## 2017-04-20 MED ORDER — MAGIC MOUTHWASH W/LIDOCAINE: 5.0000 mL | Freq: Three times a day (TID) | ORAL | 0 refills | Status: AC | PRN

## 2017-04-20 MED ORDER — SODIUM CHLORIDE 0.9 % IV BOLUS (SEPSIS)
1000.0000 mL | Freq: Once | INTRAVENOUS | Status: AC
  Administered 2017-04-20: 1000 mL via INTRAVENOUS

## 2017-04-20 MED ORDER — DEXAMETHASONE SODIUM PHOSPHATE 10 MG/ML IJ SOLN
INTRAMUSCULAR | Status: AC
  Administered 2017-04-20: 10 mg via INTRAVENOUS
  Filled 2017-04-20: qty 1

## 2017-04-20 MED ORDER — DEXAMETHASONE SODIUM PHOSPHATE 10 MG/ML IJ SOLN
10.0000 mg | Freq: Once | INTRAMUSCULAR | Status: AC
  Administered 2017-04-20: 10 mg via INTRAVENOUS

## 2017-04-20 MED ORDER — BENZONATATE 100 MG PO CAPS: 100.0000 mg | ORAL_CAPSULE | Freq: Three times a day (TID) | ORAL | 0 refills | Status: DC

## 2017-04-20 MED ORDER — KETOROLAC TROMETHAMINE 30 MG/ML IJ SOLN
30.0000 mg | Freq: Once | INTRAMUSCULAR | Status: AC
  Administered 2017-04-20: 30 mg via INTRAVENOUS

## 2017-04-20 NOTE — ED Notes (Addendum)
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  Made PA aware.  After PA spoke with patient, patient requested to speak to the doctor over the PA.  Made PA aware and she will let attending know. She also requests we hold off on the GI cocktail until she speaks with the MD.

## 2017-04-20 NOTE — ED Provider Notes (Signed)
[facility]-EMERGENCY DEPT Provider Note   CSN: [id number] Arrival date & time: 04/19/17  2135     History   Chief Complaint Chief Complaint  Patient presents with  . Sore Throat  . Cough    HPI Charleston Vierling is a 36 y.o. female who presents today for cough, sore throat, nasal drainage/congestion for approximately 3 weeks. 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 She is concerned that with all of the flying she is doing and her being sick she is dehydrated and needs an IV. Patient insisted multiple times while I was attempting to interview her that she have an IV and fluids.  She reports that her throat has been sore, more so on the left than right and that the pain is "so bad" patient was crying during interview. She has not taken any pain medication (ibuprofen or tylenol) today.  She has not been taking decongestants.    HPI  History reviewed. No pertinent past medical history.  There are no active problems to display for this patient.   Past Surgical History:  Procedure Laterality Date  . AUGMENTATION MAMMAPLASTY    . COSMETIC SURGERY      OB History    No data available       Home Medications    Prior to Admission medications   Medication Sig Start Date End Date Taking? Authorizing Provider  ALPRAZolam Prudy Feeler) 0.25 MG tablet Take 0.25 mg by mouth at bedtime as needed for anxiety.    [provider]  ibuprofen (ADVIL,MOTRIN) 200 MG tablet Take 200 mg by mouth every 6 (six) hours as needed.    [provider]    Family History Family History  Problem Relation Age of Onset  . Breast cancer Maternal Grandmother     Social History Social History  Substance Use Topics  . Smoking status: Never Smoker  . Smokeless tobacco: Never Used  . Alcohol use Yes     Allergies   Patient has no known allergies.   Review of Systems Review of Systems  Constitutional: Positive for fatigue ("I have been flying so much and the  time zone changes are making me so tired."). Negative for activity change and fever.  HENT: Positive for congestion, ear pain, postnasal drip, sinus pain, sore throat and voice change. Negative for drooling, facial swelling, mouth sores, sinus pressure, sneezing and trouble swallowing.   Eyes: Negative for visual disturbance.  Respiratory: Positive for cough. Negative for chest tightness, shortness of breath, wheezing and stridor.   Cardiovascular: Negative for chest pain and palpitations.  Gastrointestinal: Positive for nausea. Negative for abdominal pain, constipation, diarrhea and vomiting.  Genitourinary: Negative for decreased urine volume, difficulty urinating, dysuria, hematuria, pelvic pain and urgency.  Musculoskeletal: Negative for arthralgias, myalgias, neck pain and neck stiffness.  Skin: Negative for color change and rash.  Neurological: Negative for weakness, light-headedness, numbness and headaches.     Physical Exam Updated Vital Signs BP (!) 139/91   Pulse (!) 110   Temp 99.5 F (37.5 C) (Oral)   Resp 20   Ht 5\' 6"  (1.676 m)   Wt 71.7 kg (158 lb)   LMP 03/27/2017   SpO2 99%   BMI 25.50 kg/m   Physical Exam  Constitutional: She appears well-developed and well-nourished. She does not have a sickly appearance.  HENT:  Head: Normocephalic and atraumatic.  Right Ear: Tympanic membrane, external ear and ear canal normal. No middle ear effusion.  Left Ear:  Tympanic membrane, external ear and ear canal normal.  No middle ear effusion.  Nose: Rhinorrhea present. Right sinus exhibits maxillary sinus tenderness and frontal sinus tenderness. Left sinus exhibits maxillary sinus tenderness and frontal sinus tenderness.  Mouth/Throat: Uvula is midline and mucous membranes are normal. Mucous membranes are not dry. No oral lesions. No trismus in the jaw. No uvula swelling. Posterior oropharyngeal erythema present. No oropharyngeal exudate, posterior oropharyngeal edema or tonsillar  abscesses. Tonsils are 2+ on the right. Tonsils are 2+ on the left. No tonsillar exudate.  Post nasal drainage present in posterior oropharynx.  Neck: Trachea normal, normal range of motion and full passive range of motion without pain. No tracheal tenderness present. No neck rigidity. Normal range of motion present.  Pulmonary/Chest: Effort normal. No stridor. No respiratory distress.  Abdominal: Soft. Normal appearance and bowel sounds are normal. She exhibits no mass. There is no hepatosplenomegaly. There is no tenderness. There is no rigidity and no guarding.  Lymphadenopathy:       Head (right side): Submandibular, tonsillar and preauricular adenopathy present.       Head (left side): Submandibular, tonsillar and preauricular adenopathy present.    She has cervical adenopathy (Mild).  Neurological: She is alert.  Skin: Skin is warm and dry. No rash noted. She is not diaphoretic.  Psychiatric: Her mood appears anxious.  Nursing note and vitals reviewed.    ED Treatments / Results  Labs (all labs ordered are listed, but only abnormal results are displayed) Labs Reviewed  RAPID STREP SCREEN (NOT AT [facility])  CULTURE, GROUP A STREP [facility])    EKG  EKG Interpretation None       Radiology Dg Chest 2 View  Result Date: 04/19/2017 CLINICAL DATA:  36 year old female with cough. EXAM: CHEST  2 VIEW COMPARISON:  None. FINDINGS: The heart size and mediastinal contours are within normal limits. Both lungs are clear. The visualized skeletal structures are unremarkable. IMPRESSION: No active cardiopulmonary disease. Electronically Signed   By: Elgie CollardArash  Radparvar M.D.   On: 04/19/2017 22:53    Procedures Procedures (including critical care time)  Medications Ordered in ED Medications  gi cocktail (Maalox,Lidocaine,Donnatal) (not administered)  ibuprofen (ADVIL,MOTRIN) tablet 800 mg (800 mg Oral Given 04/19/17 2231)     Initial Impression / Assessment and Plan / ED Course   I offered  patient a GI cocktail to numb her throat and help with nausea, cough medication (tessalon pearls) and a rx for zofran, and steroids.  Patient says the ibuprofen given to her did not help and her throat still hurts.  Patient asked for an IV as she feels she is dehydrated despite adequate PO intake and still urinating.  I informed her that I did not feel an IV was going to change her course of illness.  I was called out of the room by a nurse, and patient then asked to speak to my supervising physician.   I informed Dr. Rhunette CroftNanavati and he and I saw the patient together.   I have reviewed the triage vital signs and the nursing notes.  Pertinent labs & imaging results that were available during my care of the patient were reviewed by me and considered in my medical decision making (see chart for details).    Vernell MorgansDanna Stern presents for evaluation of cough, sore throat, and generally not feeling well that has been present for multiple weeks.  I suspect that she has lingering cough from a combination of allergies and post nasal drip causing her throat  to hurt.  CXR negative for acute processes.   At shift change care was transferred to Dr. Rhunette Croft who will place orders, re-evaulate and determine disposition.     Final Clinical Impressions(s) / ED Diagnoses   Final diagnoses:  Sore throat    New Prescriptions New Prescriptions   No medications on file     Norman Clay 04/20/17 Gwynneth Albright, MD 04/27/17 1141

## 2017-04-22 LAB — CULTURE, GROUP A STREP (THRC)

## 2017-09-20 DIAGNOSIS — R1084 Generalized abdominal pain: Secondary | ICD-10-CM | POA: Insufficient documentation

## 2017-09-20 DIAGNOSIS — M25512 Pain in left shoulder: Secondary | ICD-10-CM | POA: Insufficient documentation

## 2017-09-20 DIAGNOSIS — R197 Diarrhea, unspecified: Secondary | ICD-10-CM | POA: Diagnosis not present

## 2017-09-20 DIAGNOSIS — Z79899 Other long term (current) drug therapy: Secondary | ICD-10-CM | POA: Insufficient documentation

## 2017-09-21 ENCOUNTER — Encounter (HOSPITAL_COMMUNITY): Payer: Self-pay

## 2017-09-21 ENCOUNTER — Emergency Department (HOSPITAL_COMMUNITY): Payer: Managed Care, Other (non HMO)

## 2017-09-21 ENCOUNTER — Emergency Department (HOSPITAL_COMMUNITY)
Admission: EM | Admit: 2017-09-21 | Discharge: 2017-09-21 | Disposition: A | Discharge status: Home / Self Care | Payer: Managed Care, Other (non HMO) | Attending: Emergency Medicine | Admitting: Emergency Medicine

## 2017-09-21 DIAGNOSIS — R197 Diarrhea, unspecified: Secondary | ICD-10-CM

## 2017-09-21 DIAGNOSIS — M25512 Pain in left shoulder: Secondary | ICD-10-CM

## 2017-09-21 LAB — I-STAT BETA HCG BLOOD, ED (MC, WL, AP ONLY)

## 2017-09-21 LAB — BASIC METABOLIC PANEL
ANION GAP: 8 (ref 5–15)
BUN: 9 mg/dL (ref 6–20)
CHLORIDE: 107 mmol/L (ref 101–111)
CO2: 21 mmol/L — ABNORMAL LOW (ref 22–32)
Calcium: 8.9 mg/dL (ref 8.9–10.3)
Creatinine, Ser: 0.73 mg/dL (ref 0.44–1.00)
GFR calc Af Amer: >60 mL/min (ref >60)
GFR calc non Af Amer: >60 mL/min (ref >60)
Glucose, Bld: 97 mg/dL (ref 65–99)
POTASSIUM: 3.3 mmol/L — AB (ref 3.5–5.1)
SODIUM: 136 mmol/L (ref 135–145)

## 2017-09-21 LAB — CBC
HEMATOCRIT: 34.6 % — AB (ref 36.0–46.0)
HEMOGLOBIN: 11.9 g/dL — AB (ref 12.0–15.0)
MCH: 30.6 pg (ref 26.0–34.0)
MCHC: 34.4 g/dL (ref 30.0–36.0)
MCV: 88.9 fL (ref 78.0–100.0)
Platelets: 236 10*3/uL (ref 150–400)
RBC: 3.89 MIL/uL (ref 3.87–5.11)
RDW: 12.6 % (ref 11.5–15.5)
WBC: 6.8 10*3/uL (ref 4.0–10.5)

## 2017-09-21 LAB — I-STAT TROPONIN, ED: Troponin i, poc: 0 ng/mL (ref 0.00–0.08)

## 2017-09-21 MED ORDER — OXYCODONE-ACETAMINOPHEN 5-325 MG PO TABS
1.0000 | ORAL_TABLET | Freq: Once | ORAL | Status: AC
  Administered 2017-09-21: 1 via ORAL
  Filled 2017-09-21: qty 1

## 2017-09-21 MED ORDER — ONDANSETRON 4 MG PO TBDP
4.0000 mg | ORAL_TABLET | Freq: Once | ORAL | Status: AC
  Administered 2017-09-21: 4 mg via ORAL
  Filled 2017-09-21: qty 1

## 2017-09-21 NOTE — ED Provider Notes (Signed)
[facility]-EMERGENCY DEPT Provider Note   CSN: [id number] Arrival date & time: 09/20/17  2346     History   Chief Complaint Chief Complaint  Patient presents with  . left side pain  . Back Pain    HPI Tracy MorgansDanna Bolin is a 36 y.o. female without significant PMHx, presenting to ED with gradual onset of left shoulder/left arm pain that began last night around 8:30PM.. Pt localizes the pain to the left trapezius and traveling down into left upper arm, worse with movement. Tried OTC advil without relief. Denies recent injury.  Also reports episode of abdominal cramping with nonbloody diarrhea that occurred yesterday morning, with some assoc nausea. States those symptoms have resolved, with pepto bismol and zofran. Denies CP, SOB, diaphoresis, urinary sx. No cardiac hx.   The history is provided by the patient.    History reviewed. No pertinent past medical history.  There are no active problems to display for this patient.   Past Surgical History:  Procedure Laterality Date  . AUGMENTATION MAMMAPLASTY    . COSMETIC SURGERY      OB History    No data available       Home Medications    Prior to Admission medications   Medication Sig Start Date End Date Taking? Authorizing Provider  ALPRAZolam Prudy Feeler(XANAX) 0.5 MG tablet Take 0.5 mg by mouth 2 (two) times daily as needed for anxiety.  09/05/17  Yes [provider]  ibuprofen (ADVIL,MOTRIN) 200 MG tablet Take 800 mg by mouth every 6 (six) hours as needed for moderate pain.   Yes [provider]  oxyCODONE-acetaminophen (PERCOCET/ROXICET) 5-325 MG tablet Take 1 tablet by mouth every 4 (four) hours as needed for moderate pain.  09/13/17  Yes [provider]  sertraline (ZOLOFT) 100 MG tablet Take 100 mg by mouth daily.   Yes [provider]    Family History Family History  Problem Relation Age of Onset  . Breast cancer Maternal Grandmother     Social History Social  History   Tobacco Use  . Smoking status: Never Smoker  . Smokeless tobacco: Never Used  Substance Use Topics  . Alcohol use: Yes  . Drug use: No     Allergies   Patient has no known allergies.   Review of Systems Review of Systems  Constitutional: Negative for diaphoresis and fever.  Respiratory: Negative for shortness of breath.   Cardiovascular: Negative for chest pain and palpitations.  Gastrointestinal: Positive for abdominal pain, diarrhea and nausea. Negative for blood in stool and vomiting.  Genitourinary: Negative for dysuria and frequency.  Musculoskeletal: Positive for myalgias.  All other systems reviewed and are negative.    Physical Exam Updated Vital Signs BP 118/77 (BP Location: Right Arm)   Pulse 76   Temp 98.4 F (36.9 C) (Oral)   Resp 16   LMP 09/07/2017   SpO2 100%   Physical Exam  Constitutional: She appears well-developed and well-nourished. No distress.  HENT:  Head: Normocephalic and atraumatic.  Eyes: Conjunctivae are normal.  Neck: Normal range of motion. Neck supple.  Cardiovascular: Normal rate, regular rhythm, normal heart sounds and intact distal pulses. Exam reveals no gallop and no friction rub.  No murmur heard. Pulmonary/Chest: Effort normal and breath sounds normal. No stridor. No respiratory distress. She has no wheezes. She has no rales.  Abdominal: Soft. Bowel sounds are normal. She exhibits no distension. There is no tenderness. There is no rebound and no guarding.  Musculoskeletal:  TTP  over left trapezius muscle, superiorly and down just medial to scapula. Some tenderness left lateral pectoral muscle and left deltoid. Normal ROM of neck and shoulder. Some pain with turning head to right. Normal strength and sensation.   Neurological: She is alert.  Skin: Skin is warm.  Psychiatric: She has a normal mood and affect. Her behavior is normal.  Nursing note and vitals reviewed.    ED Treatments / Results  Labs (all labs  ordered are listed, but only abnormal results are displayed) Labs Reviewed  BASIC METABOLIC PANEL - Abnormal; Notable for the following components:      Result Value   Potassium 3.3 (*)    CO2 21 (*)    All other components within normal limits  CBC - Abnormal; Notable for the following components:   Hemoglobin 11.9 (*)    HCT 34.6 (*)    All other components within normal limits  I-STAT TROPONIN, ED  I-STAT BETA HCG BLOOD, ED ([facility], [facility], [facility] ONLY)    EKG  EKG Interpretation  Date/Time:  Thursday September 21 2017 01:54:30 EST Ventricular Rate:  94 PR Interval:    QRS Duration: 77 QT Interval:  370 QTC Calculation: 463 R Axis:   53 Text Interpretation:  Sinus rhythm Borderline repolarization abnormality Confirmed by Nicanor AlconPalumbo, April ([id number]) on 09/21/2017 2:00:03 AM Also confirmed by Nicanor AlconPalumbo, April ([id number]), editor Elita QuickWatlington, Beverly [phone])  on 09/21/2017 7:55:13 AM       Radiology Dg Chest 2 View  Result Date: 09/21/2017 CLINICAL DATA:  Acute onset of generalized chest pain, radiating to the left shoulder. EXAM: CHEST  2 VIEW COMPARISON:  Chest radiograph performed 04/19/2017 FINDINGS: The lungs are well-aerated and clear. There is no evidence of focal opacification, pleural effusion or pneumothorax. The heart is normal in size; the mediastinal contour is within normal limits. No acute osseous abnormalities are seen. IMPRESSION: No acute cardiopulmonary process seen. Electronically Signed   By: Roanna RaiderJeffery  Chang M.D.   On: 09/21/2017 02:27    Procedures Procedures (including critical care time)  Medications Ordered in ED Medications  ondansetron (ZOFRAN-ODT) disintegrating tablet 4 mg (4 mg Oral Given 09/21/17 0248)  oxyCODONE-acetaminophen (PERCOCET/ROXICET) 5-325 MG per tablet 1 tablet (1 tablet Oral Given 09/21/17 0755)     Initial Impression / Assessment and Plan / ED Course  I have reviewed the triage vital signs and the nursing notes.  Pertinent labs & imaging results  that were available during my care of the patient were reviewed by me and considered in my medical decision making (see chart for details).     Pt presenting with multiple complaints, left shoulder pain, likely musculoskeletal, and an episode of N/D and abdominal cramping yesterday that has resolved. Abd exam is benign. Normal strength and sensation upper extremity. Normal cardiac workup, including neg trop 6 hours after onset of shoulder pain, neg CXR, unremarkable EKG. CBC and BMP normal. VSS. Pt is well-appearing, not in distress. Recommend symptomatic management of left shoulder pain and PCP follow up. Recommend bland diet as tolerated for stomach upset. Safe for discharge.  Discussed results, findings, treatment and follow up. Patient advised of return precautions. Patient verbalized understanding and agreed with plan.  Final Clinical Impressions(s) / ED Diagnoses   Final diagnoses:  Acute pain of left shoulder  Diarrhea in adult patient    ED Discharge Orders    None       Robinson, [place]Jordan N, PA-C 09/21/17 0800    Donnetta Hutchingook, Brian, MD 09/22/17 1136

## 2017-09-21 NOTE — Discharge Instructions (Signed)
Please read instructions below. Drink clear liquids until your stomach feels better. Then, slowly introduce bland foods into your diet as tolerated, such as bread, rice, apples, bananas. You can take zofran every 8 hours as needed for nausea. You can take advil every 6 hours as needed for shoulder pain. Apply ice or heat for relief. Follow up with your primary care if symptoms persist. Return to the ER for severe abdominal pain, fever, uncontrollable vomiting, or new or concerning symptoms.

## 2017-09-21 NOTE — ED Triage Notes (Signed)
Pt complains of stomach cramps earlier with diarrhea and nausea This afternoon she started having centralized chest pain radiating to her back She took pepto and nausea meds with little relief

## 2017-10-05 NOTE — Patient Instructions (Addendum)
Your procedure is scheduled on: Wednesday October 18, 2017 at 10:00 am  [facility] at: 8:30 am  Pick up the phone at the desk and dial [phone].  Call this number if you have problems the morning of surgery: [phone].  Remember: Do NOT eat food or drink any liquids after: Midnight on Tuesday January 8  Take these medicines the morning of surgery with a SIP OF WATER: Zoloft, (Xanax, Zofran if needed)   Do NOT wear jewelry (body piercing), metal hair clips/bobby pins, make-up, or nail polish. Do NOT wear lotions, powders, or perfumes.  You may wear deoderant. Do NOT shave for 48 hours prior to surgery. Do NOT bring valuables to the hospital. Contacts, dentures, or bridgework may not be worn into surgery.  Have a responsible adult drive you home and stay with you for 24 hours after your procedure

## 2017-10-13 ENCOUNTER — Other Ambulatory Visit: Payer: Self-pay

## 2017-10-13 ENCOUNTER — Encounter (HOSPITAL_COMMUNITY)
Admission: RE | Admit: 2017-10-13 | Discharge: 2017-10-13 | Disposition: A | Discharge status: Home / Self Care | Payer: Managed Care, Other (non HMO) | Source: Ambulatory Visit | Attending: Obstetrics | Admitting: Obstetrics

## 2017-10-13 ENCOUNTER — Encounter (HOSPITAL_COMMUNITY): Payer: Self-pay

## 2017-10-13 DIAGNOSIS — Z01818 Encounter for other preprocedural examination: Secondary | ICD-10-CM | POA: Diagnosis present

## 2017-10-13 HISTORY — DX: Other sleep disorders: G47.8

## 2017-10-13 HISTORY — DX: Anxiety disorder, unspecified: F41.9

## 2017-10-13 HISTORY — DX: Myoneural disorder, unspecified: G70.9

## 2017-10-13 HISTORY — DX: Adverse effect of unspecified anesthetic, initial encounter: T41.45XA

## 2017-10-13 HISTORY — DX: Bronchitis, not specified as acute or chronic: J40

## 2017-10-13 HISTORY — DX: Respiratory tuberculosis unspecified: A15.9

## 2017-10-13 HISTORY — DX: Other complications of anesthesia, initial encounter: T88.59XA

## 2017-10-13 LAB — CBC
HEMATOCRIT: 35.4 % — AB (ref 36.0–46.0)
Hemoglobin: 12.5 g/dL (ref 12.0–15.0)
MCH: 31.3 pg (ref 26.0–34.0)
MCHC: 35.3 g/dL (ref 30.0–36.0)
MCV: 88.5 fL (ref 78.0–100.0)
PLATELETS: 186 10*3/uL (ref 150–400)
RBC: 4 MIL/uL (ref 3.87–5.11)
RDW: 12.5 % (ref 11.5–15.5)
WBC: 7.3 10*3/uL (ref 4.0–10.5)

## 2017-10-13 LAB — ABO/RH: ABO/RH(D): A POS

## 2017-10-13 LAB — TYPE AND SCREEN
ABO/RH(D): A POS
ANTIBODY SCREEN: NEGATIVE

## 2017-10-18 ENCOUNTER — Encounter (HOSPITAL_COMMUNITY): Payer: Self-pay | Admitting: *Deleted

## 2017-10-18 ENCOUNTER — Ambulatory Visit (HOSPITAL_COMMUNITY): Payer: Managed Care, Other (non HMO) | Admitting: Anesthesiology

## 2017-10-18 ENCOUNTER — Ambulatory Visit (HOSPITAL_COMMUNITY)
Admission: AD | Admit: 2017-10-18 | Discharge: 2017-10-18 | Disposition: A | Discharge status: Home / Self Care | Payer: Managed Care, Other (non HMO) | Source: Ambulatory Visit | Attending: Obstetrics | Admitting: Obstetrics

## 2017-10-18 ENCOUNTER — Other Ambulatory Visit: Payer: Self-pay

## 2017-10-18 ENCOUNTER — Encounter (HOSPITAL_COMMUNITY)
Admission: AD | Disposition: A | Discharge status: Home / Self Care | Payer: Self-pay | Source: Ambulatory Visit | Attending: Obstetrics

## 2017-10-18 DIAGNOSIS — F419 Anxiety disorder, unspecified: Secondary | ICD-10-CM | POA: Insufficient documentation

## 2017-10-18 DIAGNOSIS — R102 Pelvic and perineal pain: Secondary | ICD-10-CM | POA: Insufficient documentation

## 2017-10-18 DIAGNOSIS — Z8611 Personal history of tuberculosis: Secondary | ICD-10-CM | POA: Diagnosis not present

## 2017-10-18 DIAGNOSIS — F329 Major depressive disorder, single episode, unspecified: Secondary | ICD-10-CM | POA: Insufficient documentation

## 2017-10-18 HISTORY — DX: Major depressive disorder, single episode, unspecified: F32.9

## 2017-10-18 HISTORY — DX: Depression, unspecified: F32.A

## 2017-10-18 HISTORY — PX: LAPAROSCOPY: SHX197

## 2017-10-18 HISTORY — DX: Other specified postprocedural states: Z98.890

## 2017-10-18 LAB — PREGNANCY, URINE: PREG TEST UR: NEGATIVE

## 2017-10-18 SURGERY — LAPAROSCOPY, DIAGNOSTIC
Anesthesia: General

## 2017-10-18 MED ORDER — OXYCODONE HCL 5 MG PO TABS: 5.0000 mg | ORAL_TABLET | Freq: Four times a day (QID) | ORAL | 0 refills | Status: AC | PRN

## 2017-10-18 MED ORDER — OXYCODONE HCL 5 MG/5ML PO SOLN: 5.0000 mg | Freq: Once | ORAL | Status: AC | PRN

## 2017-10-18 MED ORDER — SUGAMMADEX SODIUM 200 MG/2ML IV SOLN
INTRAVENOUS | Status: DC | PRN
  Administered 2017-10-18: 200 mg via INTRAVENOUS

## 2017-10-18 MED ORDER — SODIUM CHLORIDE 0.9 % IR SOLN
Status: DC | PRN
  Administered 2017-10-18: 3000 mL

## 2017-10-18 MED ORDER — ONDANSETRON HCL 4 MG/2ML IJ SOLN
INTRAMUSCULAR | Status: AC
  Filled 2017-10-18: qty 2

## 2017-10-18 MED ORDER — ONDANSETRON HCL 4 MG/2ML IJ SOLN: 4.0000 mg | Freq: Once | INTRAMUSCULAR | Status: DC | PRN

## 2017-10-18 MED ORDER — OXYCODONE HCL 5 MG PO TABS
5.0000 mg | ORAL_TABLET | Freq: Once | ORAL | Status: AC | PRN
  Administered 2017-10-18: 5 mg via ORAL

## 2017-10-18 MED ORDER — BUPIVACAINE HCL (PF) 0.25 % IJ SOLN
INTRAMUSCULAR | Status: DC | PRN
  Administered 2017-10-18: 7 mL

## 2017-10-18 MED ORDER — DEXAMETHASONE SODIUM PHOSPHATE 10 MG/ML IJ SOLN
INTRAMUSCULAR | Status: DC | PRN
  Administered 2017-10-18: 5 mg via INTRAVENOUS

## 2017-10-18 MED ORDER — LIDOCAINE HCL (CARDIAC) 20 MG/ML IV SOLN
INTRAVENOUS | Status: AC
  Filled 2017-10-18: qty 5

## 2017-10-18 MED ORDER — MIDAZOLAM HCL 2 MG/2ML IJ SOLN
INTRAMUSCULAR | Status: AC
  Filled 2017-10-18: qty 2

## 2017-10-18 MED ORDER — KETOROLAC TROMETHAMINE 30 MG/ML IJ SOLN
INTRAMUSCULAR | Status: AC
  Filled 2017-10-18: qty 1

## 2017-10-18 MED ORDER — FENTANYL CITRATE (PF) 250 MCG/5ML IJ SOLN
INTRAMUSCULAR | Status: AC
  Filled 2017-10-18: qty 5

## 2017-10-18 MED ORDER — PROPOFOL 10 MG/ML IV BOLUS
INTRAVENOUS | Status: AC
  Filled 2017-10-18: qty 20

## 2017-10-18 MED ORDER — FENTANYL CITRATE (PF) 100 MCG/2ML IJ SOLN
INTRAMUSCULAR | Status: DC | PRN
  Administered 2017-10-18 (×2): 100 ug via INTRAVENOUS

## 2017-10-18 MED ORDER — LIDOCAINE HCL (CARDIAC) 20 MG/ML IV SOLN
INTRAVENOUS | Status: DC | PRN
  Administered 2017-10-18: 60 mg via INTRAVENOUS

## 2017-10-18 MED ORDER — LACTATED RINGERS IV SOLN
INTRAVENOUS | Status: DC
  Administered 2017-10-18 (×2): via INTRAVENOUS

## 2017-10-18 MED ORDER — SCOPOLAMINE 1 MG/3DAYS TD PT72
1.0000 | MEDICATED_PATCH | Freq: Once | TRANSDERMAL | Status: DC
  Administered 2017-10-18: 1.5 mg via TRANSDERMAL

## 2017-10-18 MED ORDER — LACTATED RINGERS IV SOLN: INTRAVENOUS | Status: DC

## 2017-10-18 MED ORDER — FENTANYL CITRATE (PF) 100 MCG/2ML IJ SOLN
INTRAMUSCULAR | Status: AC
  Filled 2017-10-18: qty 2

## 2017-10-18 MED ORDER — PROPOFOL 10 MG/ML IV BOLUS
INTRAVENOUS | Status: DC | PRN
  Administered 2017-10-18: 150 mg via INTRAVENOUS

## 2017-10-18 MED ORDER — ROCURONIUM BROMIDE 100 MG/10ML IV SOLN
INTRAVENOUS | Status: AC
  Filled 2017-10-18: qty 1

## 2017-10-18 MED ORDER — BUPIVACAINE HCL (PF) 0.25 % IJ SOLN
INTRAMUSCULAR | Status: AC
  Filled 2017-10-18: qty 30

## 2017-10-18 MED ORDER — FENTANYL CITRATE (PF) 100 MCG/2ML IJ SOLN
25.0000 ug | INTRAMUSCULAR | Status: DC | PRN
  Administered 2017-10-18 (×2): 25 ug via INTRAVENOUS

## 2017-10-18 MED ORDER — DEXAMETHASONE SODIUM PHOSPHATE 10 MG/ML IJ SOLN
INTRAMUSCULAR | Status: AC
Start: 2017-10-18 — End: 2017-10-18
  Filled 2017-10-18: qty 1

## 2017-10-18 MED ORDER — OXYCODONE HCL 5 MG PO TABS
ORAL_TABLET | ORAL | Status: AC
  Administered 2017-10-18: 5 mg via ORAL
  Filled 2017-10-18: qty 1

## 2017-10-18 MED ORDER — MIDAZOLAM HCL 2 MG/2ML IJ SOLN
INTRAMUSCULAR | Status: DC | PRN
  Administered 2017-10-18: 2 mg via INTRAVENOUS

## 2017-10-18 MED ORDER — SCOPOLAMINE 1 MG/3DAYS TD PT72
MEDICATED_PATCH | TRANSDERMAL | Status: AC
  Filled 2017-10-18: qty 1

## 2017-10-18 MED ORDER — ONDANSETRON HCL 4 MG/2ML IJ SOLN
INTRAMUSCULAR | Status: DC | PRN
  Administered 2017-10-18: 4 mg via INTRAVENOUS

## 2017-10-18 MED ORDER — KETOROLAC TROMETHAMINE 30 MG/ML IJ SOLN
INTRAMUSCULAR | Status: DC | PRN
  Administered 2017-10-18: 30 mg via INTRAVENOUS

## 2017-10-18 SURGICAL SUPPLY — 26 items
DERMABOND ADVANCED (GAUZE/BANDAGES/DRESSINGS) ×1
DERMABOND ADVANCED .7 DNX12 (GAUZE/BANDAGES/DRESSINGS) ×1 IMPLANT
GLOVE BIOGEL PI IND STRL 6.5 (GLOVE) ×2 IMPLANT
GLOVE BIOGEL PI IND STRL 7.0 (GLOVE) ×2 IMPLANT
GLOVE BIOGEL PI INDICATOR 6.5 (GLOVE) ×2
GLOVE BIOGEL PI INDICATOR 7.0 (GLOVE) ×2
GLOVE ECLIPSE 6.0 STRL STRAW (GLOVE) ×2 IMPLANT
GOWN STRL REUS W/TWL LRG LVL3 (GOWN DISPOSABLE) ×4 IMPLANT
LIGASURE VESSEL 5MM BLUNT TIP (ELECTROSURGICAL) IMPLANT
NS IRRIG 1000ML POUR BTL (IV SOLUTION) ×2 IMPLANT
PACK LAPAROSCOPY BASIN (CUSTOM PROCEDURE TRAY) ×2 IMPLANT
PACK TRENDGUARD 450 HYBRID PRO (MISCELLANEOUS) ×1 IMPLANT
PACK TRENDGUARD 600 HYBRD PROC (MISCELLANEOUS) IMPLANT
POUCH SPECIMEN RETRIEVAL 10MM (ENDOMECHANICALS) IMPLANT
PROTECTOR NERVE ULNAR (MISCELLANEOUS) ×4 IMPLANT
SET IRRIG TUBING LAPAROSCOPIC (IRRIGATION / IRRIGATOR) ×2 IMPLANT
SLEEVE XCEL OPT CAN 5 100 (ENDOMECHANICALS) ×2 IMPLANT
SUT MNCRL AB 3-0 PS2 27 (SUTURE) ×2 IMPLANT
SUT VICRYL 0 UR6 27IN ABS (SUTURE) IMPLANT
TOWEL OR 17X24 6PK STRL BLUE (TOWEL DISPOSABLE) ×4 IMPLANT
TRAY FOLEY CATH SILVER 16FR (SET/KITS/TRAYS/PACK) ×2 IMPLANT
TRENDGUARD 450 HYBRID PRO PACK (MISCELLANEOUS) ×2
TRENDGUARD 600 HYBRID PROC PK (MISCELLANEOUS)
TROCAR XCEL NON-BLD 11X100MML (ENDOMECHANICALS) ×2 IMPLANT
TROCAR XCEL NON-BLD 5MMX100MML (ENDOMECHANICALS) ×2 IMPLANT
WARMER LAPAROSCOPE (MISCELLANEOUS) ×2 IMPLANT

## 2017-10-18 NOTE — Op Note (Signed)
PRE-OPERATIVE DIAGNOSIS:  PELVIC PAIN  POST-OPERATIVE DIAGNOSIS:  PELVIC PAIN  PROCEDURE:  Procedure(s): LAPAROSCOPY DIAGNOSTIC (N/A)  SURGEON:  Surgeon(s) and Role:    Marlow Baars* Emerita Berkemeier, MD - Primary    * Carrington ClampHorvath, Michelle, MD - Assisting  ANESTHESIA:   general  EBL:  20 mL   SPECIMEN:  No Specimen  FINDINGS:  Normal anteverted uterus.  Normal appearing ovaries and fallopian tubes bilaterally.  Normal appendix and liver.  No evidence of endometriosis.  No intra-abdominal adhesions seen.   DESCRIPTION OF PROCEDURE:   Following the appropriate informed consent the patient was taken to the operating room. She was placed in dorsal lithotomy position, and general anesthesia was administered. The abdomen, perineum, and vagina, were prepped in the normal sterile fashion.  The bladder was drained with a foley catheter.  Speculum was placed in the vagina and an acorn uterine manipulator was placed transcervically. The speculum was removed from the vagina. Attention was then turned to the abdominal portion of the case. Following the appropriate draping, 0.25% Marcaine was injected infraumbilically. An incision was made at the base of the umbilicus with a scalpel. The 5 mm Optiview trocar was used to enter the abdomen under direct visualization. Entry was confirmed and the abdomen was insufflated with  CO2 gas.  The intra-abdominal cavity was inspected and findings were as above. The uterus was elevated and the patient was placed in Trendelenburg.  The entire pelvis was not able to easily be surveyed, so an additional 5 mm port was placed in the left lower pelvis under direct visualization.   The pelvis was thoroughly and systematically examined.  The uterus was a normal size without evidence of fibroids.  The ovaries and tubes were normal bilaterally.  The appendix was unremarkable.  The liver edge was normal.  There was no evidence of endometriosis.  The posterior and anterior cul de sacs were  unremarkable.  This completed the procedure. The abdomen was desufflated, the ports were removed.  The skin was closed with dermabond.  The uterine manipulator was removed from the vagina. All sponge lap needle counts were correct x2.  The patient tolerated the procedure well and was brought to the recovery room in stable condition

## 2017-10-18 NOTE — H&P (Signed)
37 y.o.  G2P1011 presents for diagnostic laparoscopy for pelvic pain.    Patient was referred by Dr. Arlyce Dice for a long history of pelvic pain.  Painful periods started w menarche at 89, but worsened significantly in the last few years. Does have significant pain w ovulation for 12-16 hr as well as w menses. With menses--pain lasting 3-4 days (currenetly on days 3 today and painful). Heating pad / motrin / tylenol.  Pain starts w onset of bleeding. Lasts about 3d. Whole abdomen and lower back. Always gets diarrhea, rectal pain.  More left sided pain.  Previously tried ocps and experienced significant mood swings, but unsure if they helped the pain.   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  She strongly desires diagnostic laparoscopy for evaluation of endometriosis   Past Medical History:  Diagnosis Date  . Anxiety   . Bronchitis 2015  . Complication of anesthesia    THINK SHE WOKE UP DURING SURGERY  . Depression   . Hx of oral surgery    permanent upper front implant  . Neuromuscular disorder (HCC)    MUSCLE TWITCHING r/t stress  . Sleep paralysis    vivid dreams, can't move  . SVD (spontaneous vaginal delivery)    x 1  . Tuberculosis 1990   Hx at age 19 -meds x 6 months, POSITIVE SKIN TEST, NEG CHEST X-RAY     Past Surgical History:  Procedure Laterality Date  . AUGMENTATION MAMMAPLASTY    . COSMETIC SURGERY    . MENISCECTOMY Right   . TURBINATE REDUCTION  2012  . WISDOM TOOTH EXTRACTION      OB History  No data available    Social History   Socioeconomic History  . Marital status: Married    Spouse name: Not on file  . Number of children: Not on file  . Years of education: Not on file  . Highest education level: Not on file  Social Needs  . Financial resource strain: Not on file  . Food insecurity - worry: Not on file  . Food insecurity - inability: Not on file  .  Transportation needs - medical: Not on file  . Transportation needs - non-medical: Not on file  Occupational History  . Not on file  Tobacco Use  . Smoking status: Never Smoker  . Smokeless tobacco: Never Used  Substance and Sexual Activity  . Alcohol use: Yes    Comment: SOCIALLY  . Drug use: No  . Sexual activity: No    Birth control/protection: None  Other Topics Concern  . Not on file  Social History Narrative  . Not on file   Patient has no known allergies.    Vitals:   10/18/17 0901  BP: 104/65  Pulse: 76  Resp: 16  Temp: 98.1 F (36.7 C)  SpO2: 100%     General:  NAD Lungs: CTAB Cardiac: RRR Abdomen:  Soft   UPT neg in holding  A/P   37 y.o. presents for diagnostic laparoscopy for pelvic pain.    Is hesitant to try hormonal options without diagnosis as she had significant mood swings in past. Long discussion re: possible etiology of pelvic pain, including gyn / gu / gi. Given worsening pain with menses and history of secondary infertility, endometriosis is a possible diagnosis. We will plan a diagnostic laparoscopy, excision /fulguration of any possible endometriosis, possible correction of intra-abdominal adhesions. She understands that  if there is significant disease, we will not be performing hysterectomy / oophorectomy / etc at the time of surgery unless grossly abnormal ovary w concern for cancer (low suspicion given normal US today) Shyan understands that the pelvis may be entirely unremarkable and that this may not yield a true diagnosis. We discussed risks to include infection, bleeding, damage to surrounding structures, need for additional procedures, vte/pe, non-diagnostic results, anesthesia complications. She strongly wishes to proceed.  Consent signed.  UPT neg in holding    [facility] HospitalDYANNA GEFFEL RavensdaleLARK

## 2017-10-18 NOTE — Anesthesia Preprocedure Evaluation (Addendum)
Anesthesia Evaluation  Patient identified by MRN, date of birth, ID band Patient awake    Reviewed: Allergy & Precautions, NPO status , Patient's Chart, lab work & pertinent test results  Airway Mallampati: II  TM Distance: >3 FB Neck ROM: Full    Dental  (+) Teeth Intact, Dental Advisory Given   Pulmonary    breath sounds clear to auscultation       Cardiovascular  Rhythm:Regular Rate:Normal     Neuro/Psych    GI/Hepatic   Endo/Other    Renal/GU      Musculoskeletal   Abdominal   Peds  Hematology   Anesthesia Other Findings   Reproductive/Obstetrics                             Anesthesia Physical Anesthesia Plan  ASA: II  Anesthesia Plan: General   Post-op Pain Management:    Induction: Intravenous  PONV Risk Score and Plan: Ondansetron, Dexamethasone, Midazolam and Treatment may vary due to age or medical condition  Airway Management Planned: Oral ETT  Additional Equipment:   Intra-op Plan:   Post-operative Plan: Extubation in OR  Informed Consent: I have reviewed the patients History and Physical, chart, labs and discussed the procedure including the risks, benefits and alternatives for the proposed anesthesia with the patient or authorized representative who has indicated his/her understanding and acceptance.   Dental advisory given  Plan Discussed with: CRNA and Anesthesiologist  Anesthesia Plan Comments:        Anesthesia Quick Evaluation

## 2017-10-18 NOTE — Brief Op Note (Signed)
10/18/2017  10:41 AM  PATIENT:  Tracy Hartman  37 y.o. female  PRE-OPERATIVE DIAGNOSIS:  PELVIC PAIN  POST-OPERATIVE DIAGNOSIS:  PELVIC PAIN  PROCEDURE:  Procedure(s): LAPAROSCOPY DIAGNOSTIC (N/A)  SURGEON:  Surgeon(s) and Role:    Marlow Baars* Hilery Wintle, MD - Primary    * Carrington ClampHorvath, Michelle, MD - Assisting  ANESTHESIA:   general  EBL:  20 mL   BLOOD ADMINISTERED:none  DRAINS: none   LOCAL MEDICATIONS USED:  MARCAINE  SPECIMEN:  No Specimen  DISPOSITION OF SPECIMEN:  N/A  COUNTS:  YES  TOURNIQUET:  * No tourniquets in log *  DICTATION: .Note written in EPIC  PLAN OF CARE: Discharge to home after PACU  PATIENT DISPOSITION:  PACU - hemodynamically stable.   Delay start of Pharmacological VTE agent (>24hrs) due to surgical blood loss or risk of bleeding: not applicable

## 2017-10-18 NOTE — Transfer of Care (Signed)
Immediate Anesthesia Transfer of Care Note  Patient: Tracy MorgansDanna Hartman  Procedure(s) Performed: LAPAROSCOPY DIAGNOSTIC (N/A )  Patient Location: PACU  Anesthesia Type:General  Level of Consciousness: awake, alert  and oriented  Airway & Oxygen Therapy: Patient Spontanous Breathing and Patient connected to nasal cannula oxygen  Post-op Assessment: Report given to RN and Post -op Vital signs reviewed and stable  Post vital signs: Reviewed and stable  Last Vitals:  Vitals:   10/18/17 0901  BP: 104/65  Pulse: 76  Resp: 16  Temp: 36.7 C  SpO2: 100%    Last Pain:  Vitals:   10/18/17 0901  TempSrc: Oral  PainSc: 2       Patients Stated Pain Goal: 3 (10/18/17 0901)  Complications: No apparent anesthesia complications

## 2017-10-18 NOTE — Anesthesia Procedure Notes (Signed)
Procedure Name: Intubation Date/Time: 10/18/2017 10:02 AM Performed by: Jonna Munro, CRNA Pre-anesthesia Checklist: Patient identified, Emergency Drugs available, Suction available, Patient being monitored and Timeout performed Patient Re-evaluated:Patient Re-evaluated prior to induction Oxygen Delivery Method: Circle system utilized Preoxygenation: Pre-oxygenation with 100% oxygen Induction Type: IV induction Ventilation: Mask ventilation without difficulty Laryngoscope Size: Mac and 3 Grade View: Grade I Tube type: Oral Tube size: 7.0 mm Number of attempts: 1 Airway Equipment and Method: Stylet Placement Confirmation: ETT inserted through vocal cords under direct vision,  positive ETCO2 and breath sounds checked- equal and bilateral Secured at: 22 cm Tube secured with: Tape Dental Injury: Teeth and Oropharynx as per pre-operative assessment

## 2017-10-18 NOTE — Discharge Instructions (Signed)
Pelvic rest x 2 weeks (no intercourse or tampons).  No tub baths or swimming for two weeks.    Do not drive until you are not taking narcotic pain medication. No heavy lifting > 10 lbs for 2 weeks  Call your doctor if you have heavy vaginal bleeding (soaking through a pad an hour or more for >2 hours in a row), temperature >101F, severe nausea, vomiting, severe or worsening abdominal pain, dizziness, shortness of breath, chest pain or any other concerns.  Please take motrin every 6 hours and tylenol every 6 hours.  Add oxycodone if your pain is not controlled by motrin and tylenol alone.      DISCHARGE INSTRUCTIONS: Laparoscopy  The following instructions have been prepared to help you care for yourself upon your return home today.  Wound care:  Do not get the incision wet for the first 24 hours. The incision should be kept clean and dry.  The Band-Aids or dressings may be removed the day after surgery.  Should the incision become sore, red, and swollen after the first week, check with your doctor.  Personal hygiene:  Shower the day after your procedure.  Activity and limitations:  Do NOT drive or operate any equipment today.  Do NOT lift anything more than 15 pounds for 2-3 weeks after surgery.  Do NOT rest in bed all day.  Walking is encouraged. Walk each day, starting slowly with 5-minute walks 3 or 4 times a day. Slowly increase the length of your walks.  Walk up and down stairs slowly.  Do NOT do strenuous activities, such as golfing, playing tennis, bowling, running, biking, weight lifting, gardening, mowing, or vacuuming for 2-4 weeks. Ask your doctor when it is okay to start.  Diet: Eat a light meal as desired this evening. You may resume your usual diet tomorrow.  Return to work: This is dependent on the type of work you do. For the most part you can return to a desk job within a week of surgery. If you are more active at work, please discuss this with your  doctor.  What to expect after your surgery: You may have a slight burning sensation when you urinate on the first day. You may have a very small amount of blood in the urine. Expect to have a small amount of vaginal discharge/light bleeding for 1-2 weeks. It is not unusual to have abdominal soreness and bruising for up to 2 weeks. You may be tired and need more rest for about 1 week. You may experience shoulder pain for 24-72 hours. Lying flat in bed may relieve it.  Call your doctor for any of the following:  Develop a fever of 100.4 or greater  Inability to urinate 6 hours after discharge from hospital  Severe pain not relieved by pain medications  Persistent of heavy bleeding at incision site  Redness or swelling around incision site after a week  Increasing nausea or vomiting  Patient Signature________________________________________ Nurse Signature_________________________________________     Post Anesthesia Home Care Instructions  Activity: Get plenty of rest for the remainder of the day. A responsible individual must stay with you for 24 hours following the procedure.  For the next 24 hours, DO NOT: -Drive a car -Advertising copywriterperate machinery -Drink alcoholic beverages -Take any medication unless instructed by your physician -Make any legal decisions or sign important papers.  Meals: Start with liquid foods such as gelatin or soup. Progress to regular foods as tolerated. Avoid greasy, spicy, heavy foods. If nausea  and/or vomiting occur, drink only clear liquids until the nausea and/or vomiting subsides. Call your physician if vomiting continues.  Special Instructions/Symptoms: Your throat may feel dry or sore from the anesthesia or the breathing tube placed in your throat during surgery. If this causes discomfort, gargle with warm salt water. The discomfort should disappear within 24 hours.  If you had a scopolamine patch placed behind your ear for the management of post-  operative nausea and/or vomiting:  1. The medication in the patch is effective for 72 hours, after which it should be removed.  Wrap patch in a tissue and discard in the trash. Wash hands thoroughly with soap and water. 2. You may remove the patch earlier than 72 hours if you experience unpleasant side effects which may include dry mouth, dizziness or visual disturbances. 3. Avoid touching the patch. Wash your hands with soap and water after contact with the patch.      NO IBUPROFEN PRODUCTS (MOTRIN, ADVIL) OR ALEVE UNTIL 4:30PM TODAY.

## 2017-10-19 ENCOUNTER — Encounter (HOSPITAL_COMMUNITY): Payer: Self-pay | Admitting: Obstetrics

## 2017-10-27 NOTE — Anesthesia Postprocedure Evaluation (Signed)
Anesthesia Post Note  Patient: Tracy Hartman  Procedure(s) Performed: LAPAROSCOPY DIAGNOSTIC (N/A )     Patient location during evaluation: PACU Anesthesia Type: General Level of consciousness: awake and alert Pain management: pain level controlled Vital Signs Assessment: post-procedure vital signs reviewed and stable Respiratory status: spontaneous breathing, nonlabored ventilation and respiratory function stable Cardiovascular status: blood pressure returned to baseline Anesthetic complications: no    Last Vitals:  Vitals:   10/18/17 1145 10/18/17 1230  BP: 112/64 114/68  Pulse: 67 72  Resp: 16 16  Temp: 36.8 C 36.8 C  SpO2: 99% 99%    Last Pain:  Vitals:   10/19/17 1825  TempSrc:   PainSc: 5                  Joss Mcdill COKER

## 2017-11-02 ENCOUNTER — Other Ambulatory Visit: Payer: Self-pay

## 2017-11-02 ENCOUNTER — Encounter (HOSPITAL_COMMUNITY): Payer: Self-pay | Admitting: Emergency Medicine

## 2017-11-02 ENCOUNTER — Ambulatory Visit (HOSPITAL_COMMUNITY)
Admission: EM | Admit: 2017-11-02 | Discharge: 2017-11-02 | Disposition: A | Discharge status: Home / Self Care | Payer: BLUE CROSS/BLUE SHIELD | Attending: Family Medicine | Admitting: Family Medicine

## 2017-11-02 DIAGNOSIS — R112 Nausea with vomiting, unspecified: Secondary | ICD-10-CM | POA: Diagnosis not present

## 2017-11-02 DIAGNOSIS — R1032 Left lower quadrant pain: Secondary | ICD-10-CM | POA: Diagnosis present

## 2017-11-02 DIAGNOSIS — M545 Low back pain: Secondary | ICD-10-CM | POA: Diagnosis not present

## 2017-11-02 DIAGNOSIS — Z9889 Other specified postprocedural states: Secondary | ICD-10-CM | POA: Diagnosis not present

## 2017-11-02 DIAGNOSIS — Z79899 Other long term (current) drug therapy: Secondary | ICD-10-CM | POA: Diagnosis not present

## 2017-11-02 LAB — URINALYSIS, ROUTINE W REFLEX MICROSCOPIC
BACTERIA UA: NONE SEEN
Bilirubin Urine: NEGATIVE
Glucose, UA: NEGATIVE mg/dL
Ketones, ur: NEGATIVE mg/dL
Nitrite: NEGATIVE
PH: 6 (ref 5.0–8.0)
Protein, ur: NEGATIVE mg/dL
SPECIFIC GRAVITY, URINE: 1.011 (ref 1.005–1.030)

## 2017-11-02 LAB — POC URINE PREG, ED: PREG TEST UR: NEGATIVE

## 2017-11-02 MED ORDER — OXYCODONE-ACETAMINOPHEN 5-325 MG PO TABS
1.0000 | ORAL_TABLET | ORAL | Status: DC | PRN
  Administered 2017-11-02: 1 via ORAL
  Filled 2017-11-02: qty 1

## 2017-11-02 NOTE — ED Triage Notes (Signed)
Left lower back, left flank, left lower abdominal pain-onset last night.  Used heating pad/tylenol/ibuprofen.  No relief from pain.  Patient had surgery 2 weeks ago.  Follow up this coming week.  last bm was today.  Patient did have diarrhea yesterday

## 2017-11-02 NOTE — Discharge Instructions (Signed)
Since your abdomen is so tender, I recommend you to go to the Emergency room.

## 2017-11-02 NOTE — ED Triage Notes (Signed)
Pt had lap surgery two weeks ago due to pelvic pain, reports the pain started today and is moving from her back to her pelvic area on the left side.  She reports that the 800mg  Ibprofen and the 1000mg  of Tylenol have not been helping.  Pt was sent here by urgent care.  Pt reports nausea even after taking zofran this morning.

## 2017-11-02 NOTE — ED Provider Notes (Signed)
[facility]    CSN: [id number] Arrival date & time: 11/02/17  1806     History   Chief Complaint Chief Complaint  Patient presents with  . Abdominal Pain    HPI Tracy Hartman is a 37 y.o. female presenting with acute left sided back pain that wraps around back into side and LLQ. Pain onset last night- taking ibuprofen/tylenol, using heating pad without relief. She states she typically has this sensation in her LLQ area when she is ovulating but it does not typically go all the way to the back, nor last this long. She recently had laparoscopic evaluation for endometriosis on 1/09. Pain was present after procedure but eased off and was not like this. Follow up on 1/28 with OBGYN. Has felt nauseous without vomiting. Eating and drinking okay. Diarrhea yesterday, normal bowel movement today. States she typically gets some back pain with menstrual cycle.  LMP began a few days after and ended about 1 week ago.    HPI  Past Medical History:  Diagnosis Date  . Anxiety   . Bronchitis 2015  . Complication of anesthesia    THINK SHE WOKE UP DURING SURGERY  . Depression   . Hx of oral surgery    permanent upper front implant  . Neuromuscular disorder (HCC)    MUSCLE TWITCHING r/t stress  . Sleep paralysis    vivid dreams, can't move  . SVD (spontaneous vaginal delivery)    x 1  . Tuberculosis 1990   Hx at age 38 -meds x 6 months, POSITIVE SKIN TEST, NEG CHEST X-RAY     There are no active problems to display for this patient.   Past Surgical History:  Procedure Laterality Date  . AUGMENTATION MAMMAPLASTY    . COSMETIC SURGERY    . LAPAROSCOPY N/A 10/18/2017   Procedure: LAPAROSCOPY DIAGNOSTIC;  Surgeon: Marlow Baarslark, Dyanna, MD;  Location: [facility];  Service: Gynecology;  Laterality: N/A;  . MENISCECTOMY Right   . TURBINATE REDUCTION  2012  . WISDOM TOOTH EXTRACTION      OB History    No data available       Home Medications    Prior to Admission medications     Medication Sig Start Date End Date Taking? Authorizing Provider  ALPRAZolam Prudy Feeler(XANAX) 0.5 MG tablet Take 0.5 mg by mouth 2 (two) times daily as needed for anxiety.  09/05/17   [provider]  ibuprofen (ADVIL,MOTRIN) 800 MG tablet Take 800 mg by mouth every 8 (eight) hours as needed for moderate pain or cramping.    [provider]  ondansetron (ZOFRAN) 4 MG tablet TK 1 T PO BID PRN 10/11/17   [provider]  oxyCODONE (OXY IR/ROXICODONE) 5 MG immediate release tablet Take 1 tablet (5 mg total) by mouth every 6 (six) hours as needed for severe pain. 10/18/17   Marlow Baarslark, Dyanna, MD  sertraline (ZOLOFT) 100 MG tablet Take 150 mg by mouth daily.     [provider]    Family History Family History  Problem Relation Age of Onset  . Breast cancer Maternal Grandmother     Social History Social History   Tobacco Use  . Smoking status: Never Smoker  . Smokeless tobacco: Never Used  Substance Use Topics  . Alcohol use: Yes    Comment: SOCIALLY  . Drug use: No     Allergies   Patient has no known allergies.   Review of Systems Review of Systems  Constitutional: Negative for activity change,  appetite change, fatigue and fever.  Cardiovascular: Negative for chest pain.  Gastrointestinal: Positive for abdominal pain, diarrhea and nausea. Negative for blood in stool and vomiting.  Musculoskeletal: Positive for back pain and myalgias.  Skin: Negative for wound.  Neurological: Negative for dizziness, light-headedness and headaches.     Physical Exam Triage Vital Signs ED Triage Vitals  Enc Vitals Group     BP 11/02/17 1824 126/79     Pulse Rate 11/02/17 1824 86     Resp 11/02/17 1824 16     Temp 11/02/17 1824 98.4 F (36.9 C)     Temp Source 11/02/17 1824 Oral     SpO2 11/02/17 1824 99 %     Weight 11/02/17 1825 160 lb (72.6 kg)     Height 11/02/17 1825 5\' 6"  (1.676 m)     Head Circumference --      Peak Flow --      Pain Score 11/02/17 1828 6      Pain Loc --      Pain Edu? --      Excl. in GC? --    No data found.  Updated Vital Signs BP 126/79 (BP Location: Right Arm)   Pulse 86   Temp 98.4 F (36.9 C) (Oral)   Resp 16   Ht 5\' 6"  (1.676 m)   Wt 160 lb (72.6 kg)   SpO2 99%   BMI 25.82 kg/m    Physical Exam  Constitutional: She appears well-developed and well-nourished. No distress.  HENT:  Head: Normocephalic and atraumatic.  Eyes: Conjunctivae are normal.  Neck: Neck supple.  Cardiovascular: Normal rate and regular rhythm.  No murmur heard. Pulmonary/Chest: Effort normal and breath sounds normal. No respiratory distress.  Abdominal: Soft. She exhibits no distension. There is tenderness in the suprapubic area and left lower quadrant. There is no rigidity and no CVA tenderness.  Patient with extreme tenderness to palpation, becomes teary eyed and begins guarding. Feels tenderness even with auscultation. Abdomen is soft, not rigid.   Musculoskeletal: She exhibits no edema.       Arms: Patient with tenderness to left paraspinal lumbar area. Mild tenderness to palpation of more lateral lumbar muscles wrapping into abdomen.   Neurological: She is alert.  Skin: Skin is warm and dry.  Psychiatric: She has a normal mood and affect.  Nursing note and vitals reviewed.    UC Treatments / Results  Labs (all labs ordered are listed, but only abnormal results are displayed) Labs Reviewed - No data to display  EKG  EKG Interpretation None       Radiology No results found.  Procedures Procedures (including critical care time)  Medications Ordered in UC Medications - No data to display   Initial Impression / Assessment and Plan / UC Course  I have reviewed the triage vital signs and the nursing notes.  Pertinent labs & imaging results that were available during my care of the patient were reviewed by me and considered in my medical decision making (see chart for details).     Advised patient due to  extreme pain with light palpation of abdomen to be evaluated in emergency room. Abdomen seems more acute and likely needs imaging. Pain seems out of proportion to what would be expected from just ovulation or other benign abdominal/flank pathology. Back pain reproducible on exam- possibly muscular. Further evaluation is recommended.    Final Clinical Impressions(s) / UC Diagnoses   Final diagnoses:  Left lower quadrant pain  ED Discharge Orders    None       Controlled Substance Prescriptions [place] Controlled Substance Registry consulted? Not Applicable   Lew Dawes, PA-C 11/02/17 [address], [place] C, PA-C 11/02/17 1916    Lew Dawes, [place] 11/02/17 2048

## 2017-11-02 NOTE — ED Notes (Signed)
Lab work, radiology results and vital signs reviewed, no critical results at this time, no change in acuity indicated.  

## 2017-11-03 ENCOUNTER — Emergency Department (HOSPITAL_COMMUNITY)
Admission: EM | Admit: 2017-11-03 | Discharge: 2017-11-03 | Disposition: A | Discharge status: Home / Self Care | Payer: BLUE CROSS/BLUE SHIELD | Attending: Emergency Medicine | Admitting: Emergency Medicine

## 2017-11-03 ENCOUNTER — Emergency Department (HOSPITAL_COMMUNITY): Payer: BLUE CROSS/BLUE SHIELD

## 2017-11-03 DIAGNOSIS — R1032 Left lower quadrant pain: Secondary | ICD-10-CM

## 2017-11-03 LAB — CBC WITH DIFFERENTIAL/PLATELET
Basophils Absolute: 0 10*3/uL (ref 0.0–0.1)
Basophils Relative: 0 %
Eosinophils Absolute: 0.1 10*3/uL (ref 0.0–0.7)
Eosinophils Relative: 1 %
HEMATOCRIT: 34.6 % — AB (ref 36.0–46.0)
HEMOGLOBIN: 11.8 g/dL — AB (ref 12.0–15.0)
LYMPHS ABS: 2.5 10*3/uL (ref 0.7–4.0)
Lymphocytes Relative: 38 %
MCH: 30.4 pg (ref 26.0–34.0)
MCHC: 34.1 g/dL (ref 30.0–36.0)
MCV: 89.2 fL (ref 78.0–100.0)
MONO ABS: 0.6 10*3/uL (ref 0.1–1.0)
MONOS PCT: 8 %
NEUTROS ABS: 3.5 10*3/uL (ref 1.7–7.7)
NEUTROS PCT: 53 %
Platelets: 184 10*3/uL (ref 150–400)
RBC: 3.88 MIL/uL (ref 3.87–5.11)
RDW: 13 % (ref 11.5–15.5)
WBC: 6.7 10*3/uL (ref 4.0–10.5)

## 2017-11-03 LAB — COMPREHENSIVE METABOLIC PANEL
ALK PHOS: 40 U/L (ref 38–126)
ALT: 35 U/L (ref 14–54)
ANION GAP: 11 (ref 5–15)
AST: 26 U/L (ref 15–41)
Albumin: 4.3 g/dL (ref 3.5–5.0)
BILIRUBIN TOTAL: 0.7 mg/dL (ref 0.3–1.2)
BUN: 8 mg/dL (ref 6–20)
CALCIUM: 9.1 mg/dL (ref 8.9–10.3)
CO2: 22 mmol/L (ref 22–32)
CREATININE: 0.74 mg/dL (ref 0.44–1.00)
Chloride: 103 mmol/L (ref 101–111)
Glucose, Bld: 87 mg/dL (ref 65–99)
Potassium: 3.4 mmol/L — ABNORMAL LOW (ref 3.5–5.1)
Sodium: 136 mmol/L (ref 135–145)
TOTAL PROTEIN: 6.8 g/dL (ref 6.5–8.1)

## 2017-11-03 LAB — I-STAT CHEM 8, ED
BUN: 9 mg/dL (ref 6–20)
CREATININE: 0.7 mg/dL (ref 0.44–1.00)
Calcium, Ion: 1.1 mmol/L — ABNORMAL LOW (ref 1.15–1.40)
Chloride: 101 mmol/L (ref 101–111)
Glucose, Bld: 87 mg/dL (ref 65–99)
HEMATOCRIT: 33 % — AB (ref 36.0–46.0)
HEMOGLOBIN: 11.2 g/dL — AB (ref 12.0–15.0)
POTASSIUM: 4 mmol/L (ref 3.5–5.1)
Sodium: 138 mmol/L (ref 135–145)
TCO2: 27 mmol/L (ref 22–32)

## 2017-11-03 LAB — LIPASE, BLOOD: Lipase: 30 U/L (ref 11–51)

## 2017-11-03 MED ORDER — IOPAMIDOL (ISOVUE-300) INJECTION 61%
INTRAVENOUS | Status: AC
  Administered 2017-11-03: 100 mL
  Filled 2017-11-03: qty 100

## 2017-11-03 MED ORDER — ONDANSETRON HCL 4 MG/2ML IJ SOLN
4.0000 mg | Freq: Once | INTRAMUSCULAR | Status: AC
  Administered 2017-11-03: 4 mg via INTRAVENOUS
  Filled 2017-11-03: qty 2

## 2017-11-03 MED ORDER — MORPHINE SULFATE (PF) 4 MG/ML IV SOLN
4.0000 mg | Freq: Once | INTRAVENOUS | Status: AC
  Administered 2017-11-03: 4 mg via INTRAVENOUS
  Filled 2017-11-03: qty 1

## 2017-11-03 MED ORDER — NAPROXEN 500 MG PO TABS: 500.0000 mg | ORAL_TABLET | Freq: Two times a day (BID) | ORAL | 0 refills | Status: AC

## 2017-11-03 MED ORDER — NAPROXEN 250 MG PO TABS
500.0000 mg | ORAL_TABLET | Freq: Once | ORAL | Status: DC
  Filled 2017-11-03: qty 2

## 2017-11-03 MED ORDER — IOPAMIDOL (ISOVUE-300) INJECTION 61%
INTRAVENOUS | Status: AC
  Filled 2017-11-03: qty 30

## 2017-11-03 NOTE — ED Notes (Signed)
Pt requesting to speak with attending MD or PA prior to discharge. Pt waiting in hallway.

## 2017-11-03 NOTE — Discharge Instructions (Signed)
Take the prescribed medication as directed. Follow-up with your doctor on Tuesday as scheduled.  Copies of labs/imaging on back for their review. Return to the ED for new or worsening symptoms.

## 2017-11-03 NOTE — ED Provider Notes (Signed)
[facility] EMERGENCY DEPARTMENT Provider Note   CSN: [id number] Arrival date & time: 11/02/17  7829     History   Chief Complaint Chief Complaint  Patient presents with  . Pelvic Pain  . Back Pain    HPI Tracy Hartman is a 37 y.o. female.  The history is provided by the patient and medical records.  Pelvic Pain  Associated symptoms include abdominal pain.  Back Pain   Associated symptoms include abdominal pain.     37 y.o. F with hx of anxiety, depression, neuromuscular disorder, TB in childhood, presenting to the ED for LLQ abdominal pain.  States she had an ex-lap a few weeks ago on 10/18/17 for evaluation of chronic pelvic pain and possible endometriosis.  States procedure went well, no biopsies were taken to her knowledge.  States over the past 48 hours she has had worsening pain in her left lower abdomen along the area of her incision and some around her navel.  States pain is sharp and dull all at the same time.  She reports nausea/vomiting.  No fever, chills, sweats.  No urinary symptoms but states sometimes she has sensation of still needing to pee after she urinates.  She denies dysuria or hematuria.  She did just finish her menstrual cycle earlier in the week.  No vaginal discharge.  States she always has pain in her pelvis around her menses/ovulation but states this is higher up and more intense.  Has tried home meds and heating packs without relief.  Past Medical History:  Diagnosis Date  . Anxiety   . Bronchitis 2015  . Complication of anesthesia    THINK SHE WOKE UP DURING SURGERY  . Depression   . Hx of oral surgery    permanent upper front implant  . Neuromuscular disorder (HCC)    MUSCLE TWITCHING r/t stress  . Sleep paralysis    vivid dreams, can't move  . SVD (spontaneous vaginal delivery)    x 1  . Tuberculosis 1990   Hx at age 37 -meds x 6 months, POSITIVE SKIN TEST, NEG CHEST X-RAY     There are no active problems to display for  this patient.   Past Surgical History:  Procedure Laterality Date  . AUGMENTATION MAMMAPLASTY    . COSMETIC SURGERY    . LAPAROSCOPY N/A 10/18/2017   Procedure: LAPAROSCOPY DIAGNOSTIC;  Surgeon: Marlow Baars, MD;  Location: [facility];  Service: Gynecology;  Laterality: N/A;  . MENISCECTOMY Right   . TURBINATE REDUCTION  2012  . WISDOM TOOTH EXTRACTION      OB History    No data available       Home Medications    Prior to Admission medications   Medication Sig Start Date End Date Taking? Authorizing Provider  acetaminophen (TYLENOL) 500 MG tablet Take 1,000 mg by mouth every 6 (six) hours as needed for mild pain.   Yes [provider]  ALPRAZolam Prudy Feeler) 0.5 MG tablet Take 0.5 mg by mouth 2 (two) times daily as needed for anxiety.  09/05/17  Yes [provider]  ibuprofen (ADVIL,MOTRIN) 800 MG tablet Take 800 mg by mouth every 8 (eight) hours as needed for moderate pain or cramping.   Yes [provider]  ondansetron (ZOFRAN) 4 MG tablet TK 1 T PO BID PRN 10/11/17  Yes [provider]  sertraline (ZOLOFT) 100 MG tablet Take 150 mg by mouth daily.    Yes [provider]  oxyCODONE (OXY IR/ROXICODONE) 5 MG  immediate release tablet Take 1 tablet (5 mg total) by mouth every 6 (six) hours as needed for severe pain. Patient not taking: Reported on 11/03/2017 10/18/17   Marlow Baars, MD    Family History Family History  Problem Relation Age of Onset  . Breast cancer Maternal Grandmother     Social History Social History   Tobacco Use  . Smoking status: Never Smoker  . Smokeless tobacco: Never Used  Substance Use Topics  . Alcohol use: Yes    Comment: SOCIALLY  . Drug use: No     Allergies   Patient has no known allergies.   Review of Systems Review of Systems  Gastrointestinal: Positive for abdominal pain, nausea and vomiting.  Musculoskeletal: Positive for back pain.  All other systems reviewed and are  negative.    Physical Exam Updated Vital Signs BP 107/85 (BP Location: Right Arm)   Pulse 71   Temp 99.6 F (37.6 C)   Resp 18   SpO2 100%   Physical Exam  Constitutional: She is oriented to person, place, and time. She appears well-developed and well-nourished.  HENT:  Head: Normocephalic and atraumatic.  Mouth/Throat: Oropharynx is clear and moist.  Eyes: Conjunctivae and EOM are normal. Pupils are equal, round, and reactive to light.  Neck: Normal range of motion.  Cardiovascular: Normal rate, regular rhythm and normal heart sounds.  Pulmonary/Chest: Effort normal and breath sounds normal. No stridor. No respiratory distress.  Abdominal: Soft. Bowel sounds are normal. There is tenderness in the periumbilical area and left lower quadrant. There is no rebound.  Laparoscopic incisions of the umbilicus and left lower abdomen are clean without any signs of infection, I do not appreciate any overlying skin changes, drainage, or bleeding, does have some tenderness in the left side of the abdomen without peritoneal signs  Musculoskeletal: Normal range of motion.  Neurological: She is alert and oriented to person, place, and time.  Skin: Skin is warm and dry.  Psychiatric: She has a normal mood and affect.  Nursing note and vitals reviewed.    ED Treatments / Results  Labs (all labs ordered are listed, but only abnormal results are displayed) Labs Reviewed  URINALYSIS, ROUTINE W REFLEX MICROSCOPIC - Abnormal; Notable for the following components:      Result Value   Hgb urine dipstick MODERATE (*)    Leukocytes, UA TRACE (*)    Squamous Epithelial / LPF 0-5 (*)    All other components within normal limits  CBC WITH DIFFERENTIAL/PLATELET - Abnormal; Notable for the following components:   Hemoglobin 11.8 (*)    HCT 34.6 (*)    All other components within normal limits  COMPREHENSIVE METABOLIC PANEL - Abnormal; Notable for the following components:   Potassium 3.4 (*)    All  other components within normal limits  I-STAT CHEM 8, ED - Abnormal; Notable for the following components:   Calcium, Ion 1.10 (*)    Hemoglobin 11.2 (*)    HCT 33.0 (*)    All other components within normal limits  LIPASE, BLOOD  POC URINE PREG, ED    EKG  EKG Interpretation None       Radiology Ct Abdomen Pelvis W Contrast  Result Date: 11/03/2017 CLINICAL DATA:  Surgery 2 weeks ago for pelvic pain. Pain again today moving from the back to the pelvic area on the left. Nausea. EXAM: CT ABDOMEN AND PELVIS WITH CONTRAST TECHNIQUE: Multidetector CT imaging of the abdomen and pelvis was performed using the standard  protocol following bolus administration of intravenous contrast. CONTRAST:  100mL ISOVUE-300 IOPAMIDOL (ISOVUE-300) INJECTION 61% COMPARISON:  None. FINDINGS: Lower chest: Lung bases are clear.  Bilateral breast implants. Hepatobiliary: No focal liver abnormality is seen. No gallstones, gallbladder wall thickening, or biliary dilatation. Pancreas: Unremarkable. No pancreatic ductal dilatation or surrounding inflammatory changes. Spleen: Normal in size without focal abnormality. Adrenals/Urinary Tract: Adrenal glands are unremarkable. Kidneys are normal, without renal calculi, focal lesion, or hydronephrosis. Bladder is unremarkable. Stomach/Bowel: Stomach is within normal limits. Appendix appears normal. No evidence of bowel wall thickening, distention, or inflammatory changes. Vascular/Lymphatic: No significant vascular findings are present. No enlarged abdominal or pelvic lymph nodes. Reproductive: Uterus and bilateral adnexa are unremarkable. Other: No abdominal wall hernia or abnormality. No abdominopelvic ascites. Musculoskeletal: No acute or significant osseous findings. IMPRESSION: Bilateral breast implants. No acute process identified in the abdomen or pelvis. Electronically Signed   By: Burman NievesWilliam  Stevens M.D.   On: 11/03/2017 04:35    Procedures Procedures (including critical  care time)  Medications Ordered in ED Medications  oxyCODONE-acetaminophen (PERCOCET/ROXICET) 5-325 MG per tablet 1 tablet (1 tablet Oral Given 11/02/17 1956)  iopamidol (ISOVUE-300) 61 % injection (not administered)  morphine 4 MG/ML injection 4 mg (4 mg Intravenous Given 11/03/17 0205)  ondansetron (ZOFRAN) injection 4 mg (4 mg Intravenous Given 11/03/17 0204)  iopamidol (ISOVUE-300) 61 % injection (100 mLs  Contrast Given 11/03/17 0350)     Initial Impression / Assessment and Plan / ED Course  I have reviewed the triage vital signs and the nursing notes.  Pertinent labs & imaging results that were available during my care of the patient were reviewed by me and considered in my medical decision making (see chart for details).  37 year old female presenting to the ED with abdominal pain.  She had exploratory surgery earlier in the month for evaluation of ongoing pelvic pain as well as for evaluation of possible endometriosis.  She did not have any biopsies performed.  Reports continued pain.  She is afebrile and nontoxic.  Her incisions are clean without any signs of infection.  Does have some tenderness on the left side of the abdomen as well as mildly in the periumbilical region but there are no peritoneal signs.  Her UA does not show any signs of infection.  She denies any irregular vaginal bleeding or discharge.  No concern for STD as she is getting divorced and is not currently sexually active.  Given her recent surgical procedure, will obtain screening labs and CT scan for further evaluation to ensure no complicating features.  Patient's labs are reassuring.  CT does not show any acute pathology.   I recommended that she follow-up closely with her primary care doctor.  Continue NSAIDs for pain control.  Discussed plan with patient, she acknowledged understanding and agreed with plan of care.  Return precautions given for new or worsening symptoms.  Final Clinical Impressions(s) / ED Diagnoses    Final diagnoses:  Left lower quadrant pain    ED Discharge Orders        Ordered    naproxen (NAPROSYN) 500 MG tablet  2 times daily with meals     11/03/17 0531       Garlon HatchetSanders, Lisa M, PA-C 11/03/17 [id number]    Nicanor AlconPalumbo, April, MD 11/03/17 [id number]

## 2017-11-03 NOTE — ED Notes (Signed)
This RN attempted to give pt naproxen and discharge pt. Pt refusing naproxen and requesting something stronger. Lisa, PA notiMisty Stanleyfied. Misty StanleyLisa, PA reported she will come talk to the pt, but pt will not be receiving another prescription.

## 2017-11-03 NOTE — ED Notes (Signed)
Pt informed of Misty StanleyLisa, [place] will come to speak to her. Pt offered naproxen again. Pt cursing in room. [place]ittany, Consulting civil engineerCharge RN informed of delay in discharge.

## 2017-11-16 ENCOUNTER — Other Ambulatory Visit: Payer: Self-pay | Admitting: Pain Medicine

## 2017-11-16 DIAGNOSIS — M545 Low back pain: Secondary | ICD-10-CM

## 2017-11-17 ENCOUNTER — Other Ambulatory Visit: Payer: BLUE CROSS/BLUE SHIELD

## 2017-11-19 ENCOUNTER — Ambulatory Visit
Admission: RE | Admit: 2017-11-19 | Discharge: 2017-11-19 | Disposition: A | Discharge status: Home / Self Care | Payer: BLUE CROSS/BLUE SHIELD | Source: Ambulatory Visit | Attending: Pain Medicine | Admitting: Pain Medicine

## 2017-11-19 DIAGNOSIS — M545 Low back pain: Secondary | ICD-10-CM

## 2019-06-07 IMAGING — CR DG CHEST 2V
2 series · 2 of 2 positions shown · non-contrast
Comparison: Chest radiograph performed 04/19/2017

CLINICAL DATA: Acute onset of generalized chest pain, radiating to
the left shoulder.

EXAM:
CHEST  2 VIEW

[w chest pa]
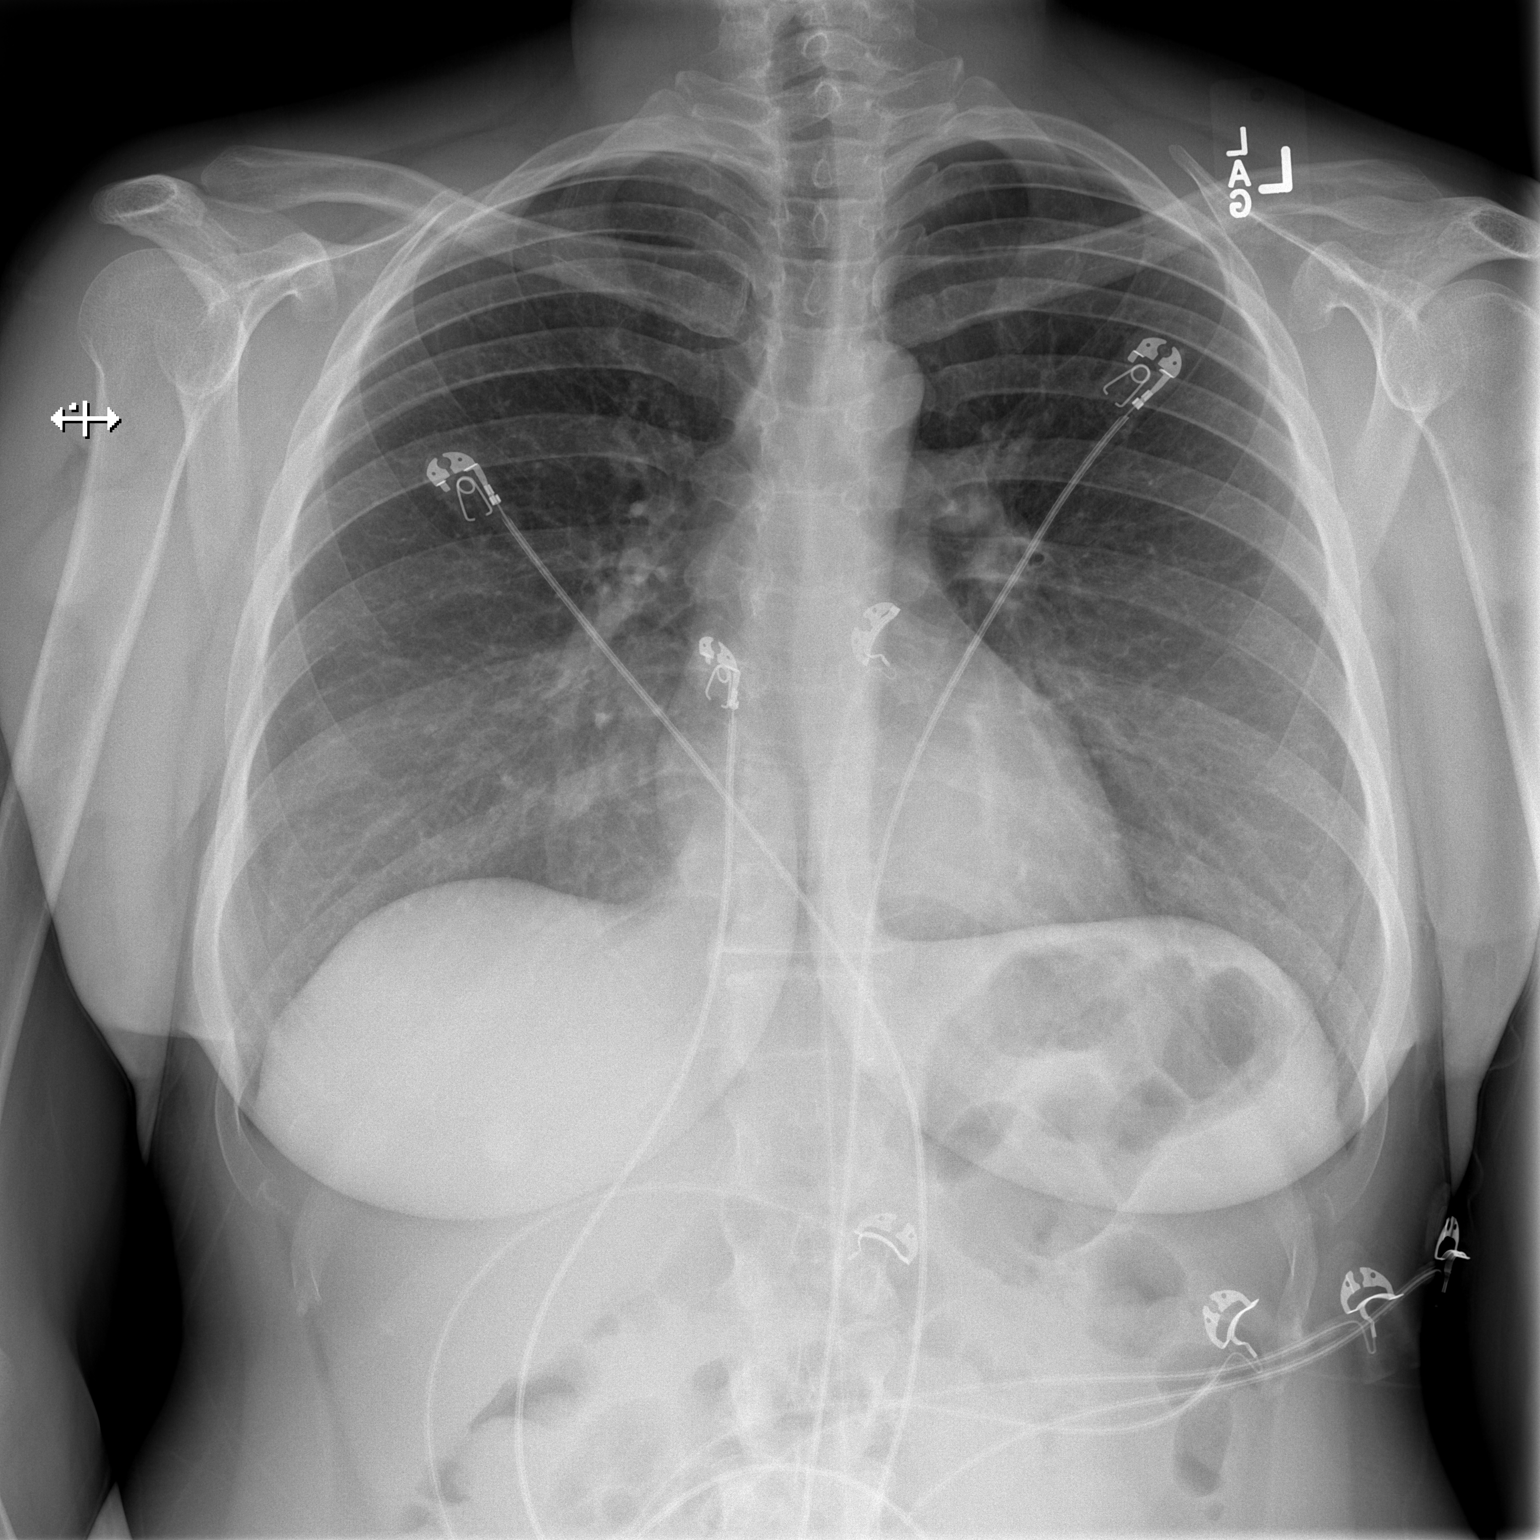

[w chest lat]
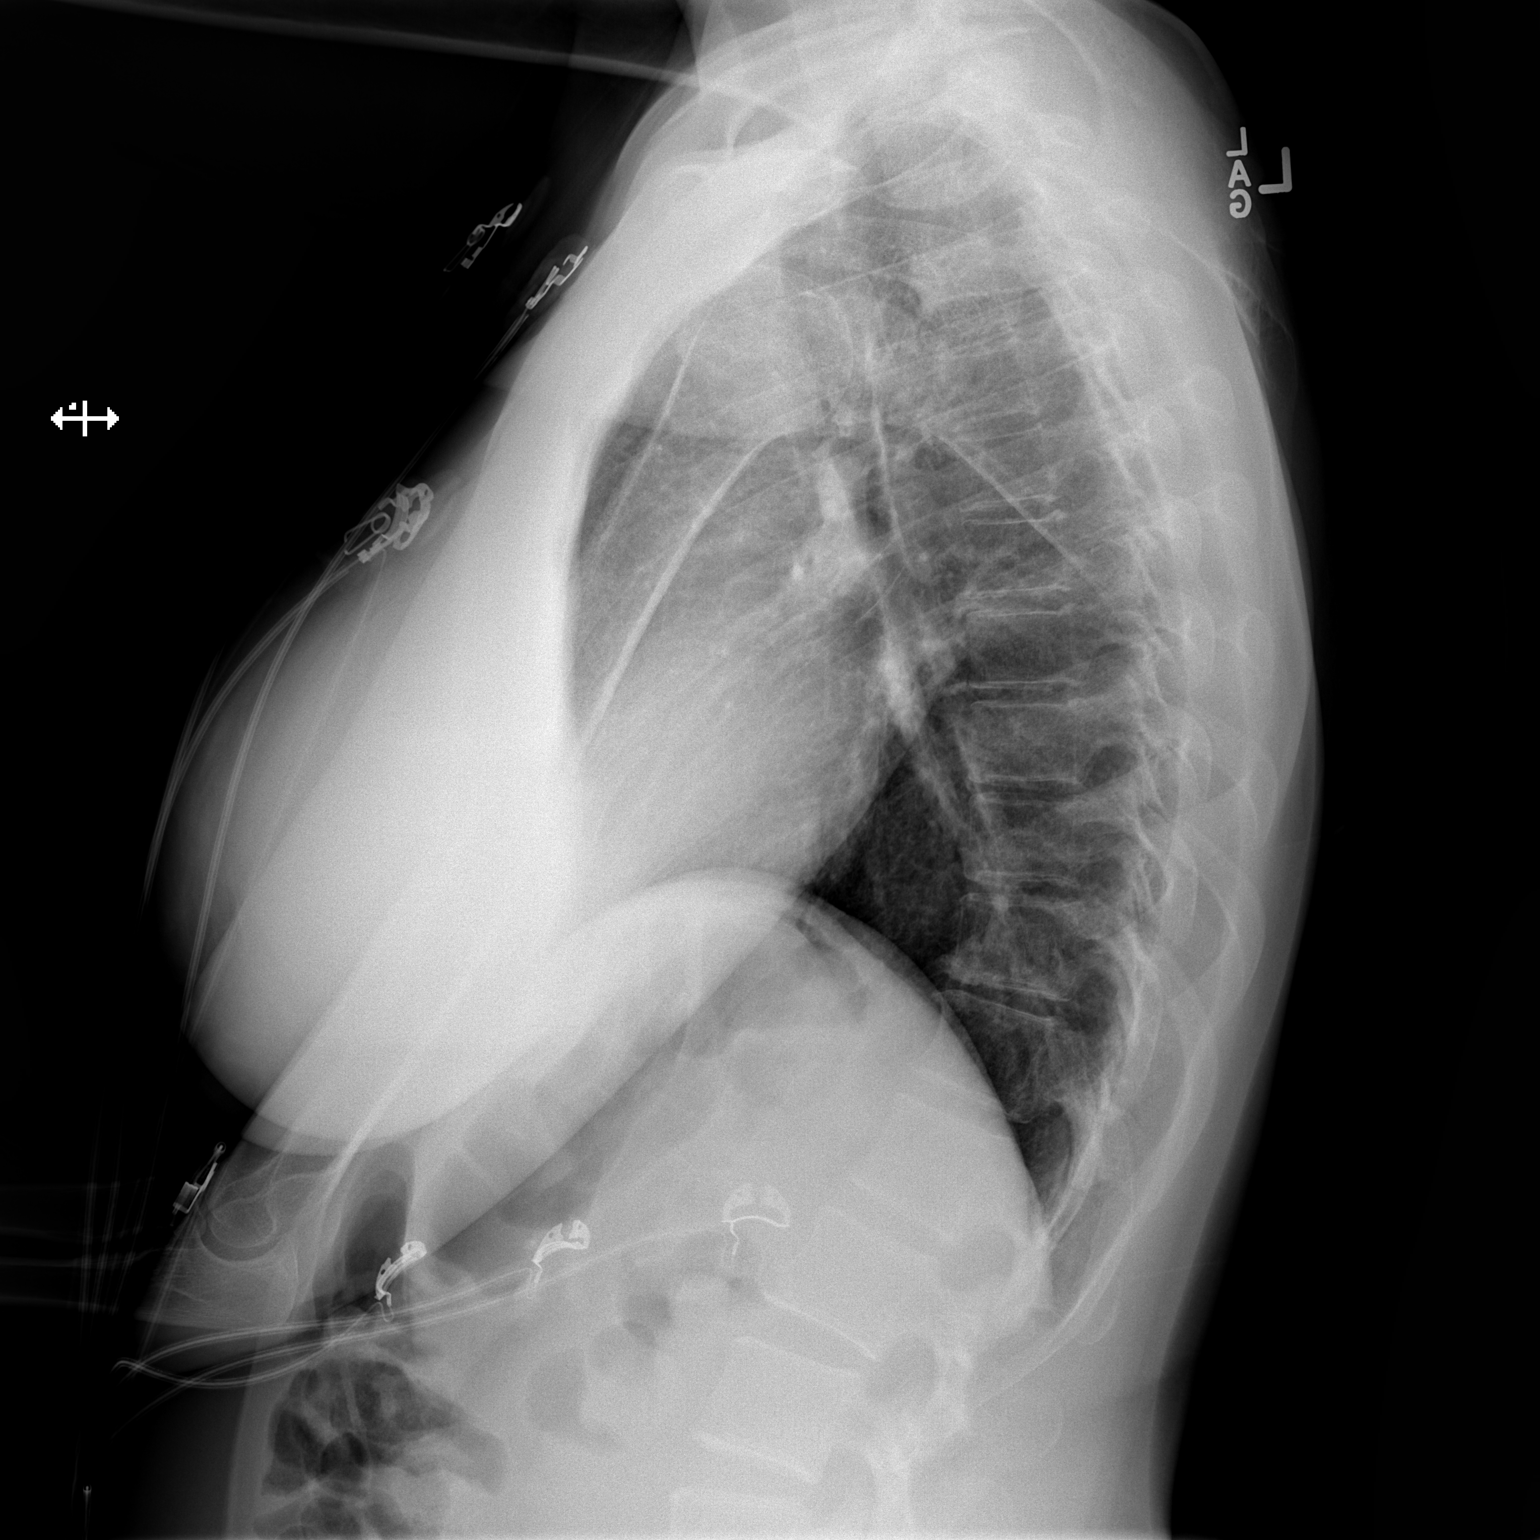

[2 of 2 positions shown; findings below may reference images not displayed]

FINDINGS: The lungs are well-aerated and clear. There is no evidence of focal
opacification, pleural effusion or pneumothorax.

The heart is normal in size; the mediastinal contour is within
normal limits. No acute osseous abnormalities are seen.
IMPRESSION: No acute cardiopulmonary process seen.
